# Patient Record
Sex: Female | Born: 1985 | Race: Black or African American | Hispanic: No | Marital: Married | Smoking: Current every day smoker
Health system: Southern US, Community
[De-identification: ages and names within clinical notes are randomized; demographics above are authoritative.]

## PROBLEM LIST (undated history)

## (undated) DIAGNOSIS — F4310 Post-traumatic stress disorder, unspecified: Secondary | ICD-10-CM

## (undated) DIAGNOSIS — F319 Bipolar disorder, unspecified: Secondary | ICD-10-CM

## (undated) DIAGNOSIS — R0600 Dyspnea, unspecified: Secondary | ICD-10-CM

## (undated) DIAGNOSIS — K219 Gastro-esophageal reflux disease without esophagitis: Secondary | ICD-10-CM

## (undated) DIAGNOSIS — F250 Schizoaffective disorder, bipolar type: Secondary | ICD-10-CM

## (undated) DIAGNOSIS — D649 Anemia, unspecified: Secondary | ICD-10-CM

## (undated) HISTORY — PX: OTHER SURGICAL HISTORY: SHX169

## (undated) HISTORY — DX: Bipolar disorder, unspecified: F31.9

## (undated) HISTORY — DX: Post-traumatic stress disorder, unspecified: F43.10

## (undated) HISTORY — DX: Schizoaffective disorder, bipolar type: F25.0

---

## 2007-08-26 HISTORY — PX: INDUCED ABORTION: SHX677

## 2015-01-07 ENCOUNTER — Emergency Department (HOSPITAL_COMMUNITY)
Admission: EM | Admit: 2015-01-07 | Discharge: 2015-01-07 | Disposition: A | Discharge status: Home / Self Care | Payer: Self-pay | Attending: Emergency Medicine | Admitting: Emergency Medicine

## 2015-01-07 ENCOUNTER — Encounter (HOSPITAL_COMMUNITY): Payer: Self-pay | Admitting: Emergency Medicine

## 2015-01-07 DIAGNOSIS — N61 Inflammatory disorders of breast: Secondary | ICD-10-CM | POA: Insufficient documentation

## 2015-01-07 DIAGNOSIS — R197 Diarrhea, unspecified: Secondary | ICD-10-CM | POA: Insufficient documentation

## 2015-01-07 DIAGNOSIS — Z720 Tobacco use: Secondary | ICD-10-CM | POA: Insufficient documentation

## 2015-01-07 DIAGNOSIS — R1084 Generalized abdominal pain: Secondary | ICD-10-CM | POA: Insufficient documentation

## 2015-01-07 DIAGNOSIS — R110 Nausea: Secondary | ICD-10-CM | POA: Insufficient documentation

## 2015-01-07 DIAGNOSIS — Z3202 Encounter for pregnancy test, result negative: Secondary | ICD-10-CM | POA: Insufficient documentation

## 2015-01-07 LAB — COMPREHENSIVE METABOLIC PANEL
ALBUMIN: 4.4 g/dL (ref 3.5–5.0)
ALT: 11 U/L — ABNORMAL LOW (ref 14–54)
AST: 12 U/L — ABNORMAL LOW (ref 15–41)
Alkaline Phosphatase: 46 U/L (ref 38–126)
Anion gap: 8 (ref 5–15)
BUN: 9 mg/dL (ref 6–20)
CALCIUM: 9.3 mg/dL (ref 8.9–10.3)
CO2: 23 mmol/L (ref 22–32)
CREATININE: 0.83 mg/dL (ref 0.44–1.00)
Chloride: 106 mmol/L (ref 101–111)
GLUCOSE: 111 mg/dL — AB (ref 65–99)
POTASSIUM: 3.9 mmol/L (ref 3.5–5.1)
Sodium: 137 mmol/L (ref 135–145)
Total Bilirubin: 0.6 mg/dL (ref 0.3–1.2)
Total Protein: 7.9 g/dL (ref 6.5–8.1)

## 2015-01-07 LAB — CBC WITH DIFFERENTIAL/PLATELET
BASOS PCT: 0 % (ref 0–1)
Basophils Absolute: 0 10*3/uL (ref 0.0–0.1)
EOS PCT: 2 % (ref 0–5)
Eosinophils Absolute: 0.1 10*3/uL (ref 0.0–0.7)
HCT: 38.4 % (ref 36.0–46.0)
Hemoglobin: 12 g/dL (ref 12.0–15.0)
LYMPHS ABS: 2 10*3/uL (ref 0.7–4.0)
Lymphocytes Relative: 39 % (ref 12–46)
MCH: 22.5 pg — AB (ref 26.0–34.0)
MCHC: 31.3 g/dL (ref 30.0–36.0)
MCV: 72 fL — ABNORMAL LOW (ref 78.0–100.0)
MONO ABS: 0.3 10*3/uL (ref 0.1–1.0)
Monocytes Relative: 5 % (ref 3–12)
NEUTROS ABS: 2.8 10*3/uL (ref 1.7–7.7)
NEUTROS PCT: 54 % (ref 43–77)
PLATELETS: 273 10*3/uL (ref 150–400)
RBC: 5.33 MIL/uL — AB (ref 3.87–5.11)
RDW: 15.6 % — ABNORMAL HIGH (ref 11.5–15.5)
WBC: 5.2 10*3/uL (ref 4.0–10.5)

## 2015-01-07 LAB — URINALYSIS, ROUTINE W REFLEX MICROSCOPIC
BILIRUBIN URINE: NEGATIVE
Glucose, UA: NEGATIVE mg/dL
HGB URINE DIPSTICK: NEGATIVE
Ketones, ur: NEGATIVE mg/dL
Leukocytes, UA: NEGATIVE
Nitrite: NEGATIVE
PH: 7 (ref 5.0–8.0)
Protein, ur: NEGATIVE mg/dL
Specific Gravity, Urine: 1.03 (ref 1.005–1.030)
UROBILINOGEN UA: 0.2 mg/dL (ref 0.0–1.0)

## 2015-01-07 LAB — PREGNANCY, URINE: PREG TEST UR: NEGATIVE

## 2015-01-07 LAB — LIPASE, BLOOD: LIPASE: 21 U/L — AB (ref 22–51)

## 2015-01-07 MED ORDER — SULFAMETHOXAZOLE-TRIMETHOPRIM 800-160 MG PO TABS: 1.0000 | ORAL_TABLET | Freq: Two times a day (BID) | ORAL | Status: DC

## 2015-01-07 MED ORDER — SULFAMETHOXAZOLE-TRIMETHOPRIM 800-160 MG PO TABS
1.0000 | ORAL_TABLET | Freq: Once | ORAL | Status: AC
  Administered 2015-01-07: 1 via ORAL
  Filled 2015-01-07: qty 1

## 2015-01-07 MED ORDER — HYDROCODONE-ACETAMINOPHEN 5-325 MG PO TABS
2.0000 | ORAL_TABLET | Freq: Once | ORAL | Status: AC
  Administered 2015-01-07: 2 via ORAL
  Filled 2015-01-07: qty 2

## 2015-01-07 MED ORDER — HYDROCODONE-ACETAMINOPHEN 5-325 MG PO TABS: 1.0000 | ORAL_TABLET | ORAL | Status: DC | PRN

## 2015-01-07 NOTE — ED Notes (Signed)
Awake. Verbally responsive. A/O x4. Resp even and unlabored. No audible adventitious breath sounds noted. ABC's intact.  

## 2015-01-07 NOTE — Discharge Instructions (Signed)
Take bactrim for cellulitis as directed until gone. Take vicodin for pain. Refer to attached documents for more information.

## 2015-01-07 NOTE — ED Notes (Signed)
Pt states she has a knot on the right breast x 2 days, also c/o n/d for the last two days. Pt also c/o generalized abd pain.

## 2015-01-07 NOTE — ED Provider Notes (Signed)
CSN: [id number]     Arrival date & time 01/07/15  1316 History   First MD Initiated Contact with Patient 01/07/15 1506     Chief Complaint  Patient presents with  . knot on right breast   . Nausea  . Diarrhea     (Consider location/radiation/quality/duration/timing/severity/associated sxs/prior Treatment) HPI Comments: Patient is a 29 year old female who presents with a 2 day history of abdominal pain and "knot" on her right breast. The pain is located in her generalized abdomen and does not radiate. The pain is described as cramping and moderate. The pain started gradually and progressively worsened since the onset. No alleviating/aggravating factors. The patient has tried nothing for symptoms without relief. Associated symptoms include diarrhea and some nausea. Patient denies fever, headache, vomiting, chest pain, SOB, dysuria, constipation, abnormal vaginal bleeding/discharge. Patient also complains of a painful knot on her right breast that she noticed in the shower 2 days ago. Palpation makes the pain worse. She reports associated redness. No alleviating factors.      History reviewed. No pertinent past medical history. History reviewed. No pertinent past surgical history. No family history on file. History  Substance Use Topics  . Smoking status: Current Every Day Smoker  . Smokeless tobacco: Not on file  . Alcohol Use: No   OB History    No data available     Review of Systems  Constitutional: Negative for fever, chills and fatigue.  HENT: Negative for trouble swallowing.   Eyes: Negative for visual disturbance.  Respiratory: Negative for shortness of breath.   Cardiovascular: Negative for chest pain and palpitations.  Gastrointestinal: Positive for abdominal pain. Negative for nausea, vomiting and diarrhea.  Genitourinary: Negative for dysuria and difficulty urinating.  Musculoskeletal: Negative for arthralgias and neck pain.  Skin: Positive for color change.   Neurological: Negative for dizziness and weakness.  Psychiatric/Behavioral: Negative for dysphoric mood.      Allergies  Other  Home Medications   Prior to Admission medications   Not on File   BP 142/92 mmHg  Pulse 86  Temp(Src) 98.9 F (37.2 C) (Oral)  Resp 20  Ht 5\' 2"  (1.575 m)  Wt 195 lb (88.451 kg)  BMI 35.66 kg/m2  SpO2 100%  LMP 12/23/2014 (Exact Date) Physical Exam  Constitutional: She is oriented to person, place, and time. She appears well-developed and well-nourished. No distress.  HENT:  Head: Normocephalic and atraumatic.  Eyes: Conjunctivae and EOM are normal.  Neck: Normal range of motion.  Cardiovascular: Normal rate and regular rhythm.  Exam reveals no gallop and no friction rub.   No murmur heard. Pulmonary/Chest: Effort normal and breath sounds normal. She has no wheezes. She has no rales. She exhibits no tenderness.  Abdominal: Soft. She exhibits no distension. There is no tenderness. There is no rebound.  Musculoskeletal: Normal range of motion.  Neurological: She is alert and oriented to person, place, and time. Coordination normal.  Speech is goal-oriented. Moves limbs without ataxia.   Skin: Skin is warm and dry.  3x2cm area of erythema and edema which is localized to the lower, inner right breast. No abscess or open wound.   Psychiatric: She has a normal mood and affect. Her behavior is normal.  Nursing note and vitals reviewed.   ED Course  Procedures (including critical care time) Labs Review Labs Reviewed  CBC WITH DIFFERENTIAL/PLATELET - Abnormal; Notable for the following:    RBC 5.33 (*)    MCV 72.0 (*)    [facility]  22.[facility] (*)    RDW 15.6 (*)    All other components within normal limits  COMPREHENSIVE METABOLIC PANEL - Abnormal; Notable for the following:    Glucose, Bld 111 (*)    AST 12 (*)    ALT 11 (*)    All other components within normal limits  LIPASE, BLOOD - Abnormal; Notable for the following:    Lipase 21 (*)    All  other components within normal limits  URINALYSIS, ROUTINE W REFLEX MICROSCOPIC - Abnormal; Notable for the following:    APPearance CLOUDY (*)    All other components within normal limits  PREGNANCY, URINE    Imaging Review No results found.   EKG Interpretation None      MDM   Final diagnoses:  Generalized abdominal pain  Cellulitis of female breast    4:22 PM Labs and urinalysis unremarkable for acute changes. Vitals stable and patient afebrile. Patient has a small area of right breast cellulitis in lower inner quadrant. No abscess. Patient will treated with antibiotics. Abdominal pain likely due to diarrhea from viral illness.    Emilia BeckKaitlyn Edward Guthmiller, PA-C 01/07/15 1640  Richardean Canalavid H Yao, MD 01/07/15 [phone]

## 2015-01-20 ENCOUNTER — Emergency Department (HOSPITAL_COMMUNITY)
Admission: EM | Admit: 2015-01-20 | Discharge: 2015-01-20 | Disposition: A | Discharge status: Home / Self Care | Payer: Self-pay | Attending: Emergency Medicine | Admitting: Emergency Medicine

## 2015-01-20 ENCOUNTER — Encounter (HOSPITAL_COMMUNITY): Payer: Self-pay | Admitting: Emergency Medicine

## 2015-01-20 DIAGNOSIS — Z720 Tobacco use: Secondary | ICD-10-CM | POA: Insufficient documentation

## 2015-01-20 DIAGNOSIS — K088 Other specified disorders of teeth and supporting structures: Secondary | ICD-10-CM | POA: Insufficient documentation

## 2015-01-20 DIAGNOSIS — Z792 Long term (current) use of antibiotics: Secondary | ICD-10-CM | POA: Insufficient documentation

## 2015-01-20 DIAGNOSIS — K0889 Other specified disorders of teeth and supporting structures: Secondary | ICD-10-CM

## 2015-01-20 DIAGNOSIS — K029 Dental caries, unspecified: Secondary | ICD-10-CM | POA: Insufficient documentation

## 2015-01-20 DIAGNOSIS — M542 Cervicalgia: Secondary | ICD-10-CM | POA: Insufficient documentation

## 2015-01-20 DIAGNOSIS — K006 Disturbances in tooth eruption: Secondary | ICD-10-CM | POA: Insufficient documentation

## 2015-01-20 MED ORDER — IBUPROFEN 800 MG PO TABS
800.0000 mg | ORAL_TABLET | Freq: Three times a day (TID) | ORAL | Status: DC
Start: 2015-01-20 — End: 2015-07-26

## 2015-01-20 MED ORDER — LIDOCAINE VISCOUS 2 % MT SOLN
20.0000 mL | OROMUCOSAL | Status: DC | PRN
Start: 2015-01-20 — End: 2015-07-26

## 2015-01-20 MED ORDER — AMOXICILLIN 500 MG PO CAPS
500.0000 mg | ORAL_CAPSULE | Freq: Three times a day (TID) | ORAL | Status: DC
Start: 2015-01-20 — End: 2015-07-26

## 2015-01-20 MED ORDER — LIDOCAINE VISCOUS 2 % MT SOLN
15.0000 mL | Freq: Once | OROMUCOSAL | Status: AC
  Administered 2015-01-20: 15 mL via OROMUCOSAL
  Filled 2015-01-20: qty 15

## 2015-01-20 NOTE — ED Notes (Signed)
Pt reports mouth pain on l/back side of lower jaw x 4 days

## 2015-01-20 NOTE — ED Provider Notes (Signed)
CSN: [id number]     Arrival date & time 01/20/15  1702 History  This chart was scribed for Francee PiccoloJennifer Taeya Theall, PA-C working with No att. providers found by Elveria Risingimelie Horne, ED Scribe. This patient was seen in room WTR9/WTR9 and the patient's care was started at 5:08 PM.   Chief Complaint  Patient presents with  . Dental Pain    mouth pain x 3 days   Patient is a 29 y.o. female presenting with tooth pain. The history is provided by the patient. No language interpreter was used.  Dental Pain Location:  Lower Quality:  Throbbing Worsened by:  Touching Associated symptoms: gum swelling and neck pain   Associated symptoms: no facial swelling and no fever    HPI Comments: Amy Salinas is a 29 y.o. female who presents to the Emergency Department complaining of throbbing lower left dental pain and swelling for 2-3 days. Patient reports onset of dental pain following a course of Bactrim for an abscess to her chest. Patient reports exacerbated pain with chewing and applied pressure. Patient reports treatment with Tylenol and Orajel without relief. Patient denies drainage from the site or difficulty breathing. Patient report pain in her left neck and behind left ear. Patient does not have a dentist.   History reviewed. No pertinent past medical history. History reviewed. No pertinent past surgical history. Family History  Problem Relation Age of Onset  . Hypertension Father   . Diabetes Father    History  Substance Use Topics  . Smoking status: Current Every Day Smoker  . Smokeless tobacco: Not on file  . Alcohol Use: No   OB History    No data available     Review of Systems  Constitutional: Negative for fever.  HENT: Positive for dental problem. Negative for facial swelling.   Musculoskeletal: Positive for neck pain.  All other systems reviewed and are negative.     Allergies  Other  Home Medications   Prior to Admission medications   Medication Sig Start Date End Date  Taking? Authorizing Provider  amoxicillin (AMOXIL) 500 MG capsule Take 1 capsule (500 mg total) by mouth 3 (three) times daily. 01/20/15   Francee PiccoloJennifer Aidan Caloca, PA-C  HYDROcodone-acetaminophen (NORCO/VICODIN) 5-325 MG per tablet Take 1-2 tablets by mouth every 4 (four) hours as needed for moderate pain. 01/07/15   Kaitlyn Szekalski, PA-C  ibuprofen (ADVIL,MOTRIN) 800 MG tablet Take 1 tablet (800 mg total) by mouth 3 (three) times daily. 01/20/15   Journee Kohen, PA-C  lidocaine (XYLOCAINE) 2 % solution Use as directed 20 mLs in the mouth or throat as needed for mouth pain. 01/20/15   Brettany Sydney, PA-C  sulfamethoxazole-trimethoprim (BACTRIM DS,SEPTRA DS) 800-160 MG per tablet Take 1 tablet by mouth 2 (two) times daily. 01/07/15   Kaitlyn Szekalski, PA-C   BP 118/75 mmHg  Pulse 74  Temp(Src) 98.6 F (37 C) (Oral)  SpO2 99%  LMP 12/23/2014 (Exact Date) Physical Exam  Constitutional: She is oriented to person, place, and time. She appears well-developed and well-nourished. No distress.  HENT:  Head: Normocephalic and atraumatic.  Right Ear: External ear normal.  Left Ear: External ear normal.  Nose: Nose normal.  Mouth/Throat: Uvula is midline, oropharynx is clear and moist and mucous membranes are normal. No trismus in the jaw. Abnormal dentition. Dental caries present. No dental abscesses or uvula swelling.    Submental and sublingual spaces are soft.  Eyes: Conjunctivae are normal.  Neck: Normal range of motion. Neck supple.  No nuchal rigidity.  Cardiovascular: Normal rate, regular rhythm and normal heart sounds.   Pulmonary/Chest: Effort normal and breath sounds normal.  Abdominal: Soft.  Musculoskeletal: Normal range of motion.  Neurological: She is alert and oriented to person, place, and time.  Skin: Skin is warm and dry. She is not diaphoretic.  Psychiatric: She has a normal mood and affect.  Nursing note and vitals reviewed.   ED Course  Procedures (including  critical care time) Medications  lidocaine (XYLOCAINE) 2 % viscous mouth solution 15 mL (15 mLs Mouth/Throat Given 01/20/15 1724)    COORDINATION OF CARE: 5:14 PM- Will discharge with antibiotics and refer to dentist. Discussed treatment plan with patient at bedside and patient agreed to plan.   Labs Review Labs Reviewed - No data to display  Imaging Review No results found.   EKG Interpretation None      MDM   Final diagnoses:  Pain, dental    Filed Vitals:   01/20/15 1725  BP: 118/75  Pulse: 74  Temp: 98.6 F (37 C)   Afebrile, NAD, non-toxic appearing, AAOx4.  Patient with toothache.  No gross abscess.  Exam unconcerning for Ludwig's angina or spread of infection.  Will treat with Amoxil and Xylocaine.  Urged patient to follow-up with dentist.  Patient is stable at time of discharge     I personally performed the services described in this documentation, which was scribed in my presence. The recorded information has been reviewed and is accurate.     Francee Piccolo, PA-C 01/20/15 1754  Linwood Dibbles, MD 01/24/15 [phone]

## 2015-01-20 NOTE — Discharge Instructions (Signed)
Please follow up with Dr. Lucky CowboyKnox to schedule a follow up appointment.  Please take your antibiotic until completion. Please read all discharge instructions and return precautions.    Dental Pain A tooth ache may be caused by cavities (tooth decay). Cavities expose the nerve of the tooth to air and hot or cold temperatures. It may come from an infection or abscess (also called a boil or furuncle) around your tooth. It is also often caused by dental caries (tooth decay). This causes the pain you are having. DIAGNOSIS  Your caregiver can diagnose this problem by exam. TREATMENT   If caused by an infection, it may be treated with medications which kill germs (antibiotics) and pain medications as prescribed by your caregiver. Take medications as directed.  Only take over-the-counter or prescription medicines for pain, discomfort, or fever as directed by your caregiver.  Whether the tooth ache today is caused by infection or dental disease, you should see your dentist as soon as possible for further care. SEEK MEDICAL CARE IF: The exam and treatment you received today has been provided on an emergency basis only. This is not a substitute for complete medical or dental care. If your problem worsens or new problems (symptoms) appear, and you are unable to meet with your dentist, call or return to this location. SEEK IMMEDIATE MEDICAL CARE IF:   You have a fever.  You develop redness and swelling of your face, jaw, or neck.  You are unable to open your mouth.  You have severe pain uncontrolled by pain medicine. MAKE SURE YOU:   Understand these instructions.  Will watch your condition.  Will get help right away if you are not doing well or get worse. Document Released: 08/11/2005 Document Revised: 11/03/2011 Document Reviewed: 03/29/2008 [facility] Patient Information 2015 [employer] [place]. This information is not intended to replace advice given to you by your health care provider. Make sure you  discuss any questions you have with your health care provider.

## 2015-07-26 ENCOUNTER — Emergency Department (HOSPITAL_COMMUNITY)
Admission: EM | Admit: 2015-07-26 | Discharge: 2015-07-26 | Disposition: A | Discharge status: Home / Self Care | Payer: 59 | Attending: Emergency Medicine | Admitting: Emergency Medicine

## 2015-07-26 ENCOUNTER — Encounter (HOSPITAL_COMMUNITY): Payer: Self-pay | Admitting: Emergency Medicine

## 2015-07-26 DIAGNOSIS — R110 Nausea: Secondary | ICD-10-CM

## 2015-07-26 DIAGNOSIS — R1110 Vomiting, unspecified: Secondary | ICD-10-CM

## 2015-07-26 DIAGNOSIS — R109 Unspecified abdominal pain: Secondary | ICD-10-CM | POA: Insufficient documentation

## 2015-07-26 DIAGNOSIS — R197 Diarrhea, unspecified: Secondary | ICD-10-CM | POA: Insufficient documentation

## 2015-07-26 DIAGNOSIS — Z79899 Other long term (current) drug therapy: Secondary | ICD-10-CM | POA: Insufficient documentation

## 2015-07-26 DIAGNOSIS — G8929 Other chronic pain: Secondary | ICD-10-CM | POA: Insufficient documentation

## 2015-07-26 DIAGNOSIS — K529 Noninfective gastroenteritis and colitis, unspecified: Secondary | ICD-10-CM

## 2015-07-26 DIAGNOSIS — R112 Nausea with vomiting, unspecified: Secondary | ICD-10-CM | POA: Insufficient documentation

## 2015-07-26 DIAGNOSIS — F17210 Nicotine dependence, cigarettes, uncomplicated: Secondary | ICD-10-CM | POA: Insufficient documentation

## 2015-07-26 LAB — URINALYSIS, ROUTINE W REFLEX MICROSCOPIC
Bilirubin Urine: NEGATIVE
GLUCOSE, UA: NEGATIVE mg/dL
Hgb urine dipstick: NEGATIVE
KETONES UR: NEGATIVE mg/dL
LEUKOCYTES UA: NEGATIVE
Nitrite: NEGATIVE
PH: 7.5 (ref 5.0–8.0)
Protein, ur: NEGATIVE mg/dL
Specific Gravity, Urine: 1.013 (ref 1.005–1.030)

## 2015-07-26 LAB — COMPREHENSIVE METABOLIC PANEL
ALBUMIN: 4.9 g/dL (ref 3.5–5.0)
ALT: 13 U/L — ABNORMAL LOW (ref 14–54)
AST: 14 U/L — ABNORMAL LOW (ref 15–41)
Alkaline Phosphatase: 55 U/L (ref 38–126)
Anion gap: 6 (ref 5–15)
BUN: 7 mg/dL (ref 6–20)
CHLORIDE: 104 mmol/L (ref 101–111)
CO2: 27 mmol/L (ref 22–32)
Calcium: 9.5 mg/dL (ref 8.9–10.3)
Creatinine, Ser: 0.78 mg/dL (ref 0.44–1.00)
GFR calc Af Amer: >60 mL/min (ref >60)
GFR calc non Af Amer: >60 mL/min (ref >60)
GLUCOSE: 87 mg/dL (ref 65–99)
POTASSIUM: 4.3 mmol/L (ref 3.5–5.1)
SODIUM: 137 mmol/L (ref 135–145)
Total Bilirubin: 0.6 mg/dL (ref 0.3–1.2)
Total Protein: 8.3 g/dL — ABNORMAL HIGH (ref 6.5–8.1)

## 2015-07-26 LAB — CBC
HEMATOCRIT: 37.7 % (ref 36.0–46.0)
Hemoglobin: 12.3 g/dL (ref 12.0–15.0)
MCH: 23.5 pg — AB (ref 26.0–34.0)
MCHC: 32.6 g/dL (ref 30.0–36.0)
MCV: 72.1 fL — AB (ref 78.0–100.0)
Platelets: 299 10*3/uL (ref 150–400)
RBC: 5.23 MIL/uL — ABNORMAL HIGH (ref 3.87–5.11)
RDW: 15.1 % (ref 11.5–15.5)
WBC: 6 10*3/uL (ref 4.0–10.5)

## 2015-07-26 LAB — LIPASE, BLOOD: Lipase: 28 U/L (ref 11–51)

## 2015-07-26 LAB — I-STAT BETA HCG BLOOD, ED (MC, WL, AP ONLY): I-stat hCG, quantitative: <5 m[IU]/mL (ref <5)

## 2015-07-26 MED ORDER — DICYCLOMINE HCL 20 MG PO TABS: 20.0000 mg | ORAL_TABLET | Freq: Two times a day (BID) | ORAL | Status: DC

## 2015-07-26 MED ORDER — DICYCLOMINE HCL 20 MG PO TABS
20.0000 mg | ORAL_TABLET | Freq: Once | ORAL | Status: AC
  Administered 2015-07-26: 20 mg via ORAL
  Filled 2015-07-26: qty 1

## 2015-07-26 MED ORDER — ONDANSETRON 8 MG PO TBDP: ORAL_TABLET | ORAL | Status: DC

## 2015-07-26 MED ORDER — ONDANSETRON 8 MG PO TBDP
8.0000 mg | ORAL_TABLET | Freq: Once | ORAL | Status: AC
  Administered 2015-07-26: 8 mg via ORAL
  Filled 2015-07-26: qty 1

## 2015-07-26 NOTE — ED Notes (Signed)
Patient reports nausea, vomiting and diarrhea since she was 29 years old.  Reports that she has been seen previously for same without further diagnosis.  Patient reports that she has her period for 3 weeks out of a month every month.  Reports "I wake up vomiting and I can only eat sometimes".

## 2015-07-26 NOTE — ED Notes (Signed)
Pt states that within 2 months she went from 230 to 195lbs.

## 2015-07-26 NOTE — Discharge Instructions (Signed)
1. Medications: zofran, bentyl, usual home medications 2. Treatment: rest, drink plenty of fluids, advance diet slowly 3. Follow Up: Please followup with [facility] in 3-5 days for discussion of your diagnoses and further evaluation after today's visit; if you do not have a primary care doctor use the resource guide provided to find one; Please return to the ER for persistent vomiting, high fevers or worsening symptoms

## 2015-07-26 NOTE — ED Notes (Signed)
Pt c/o lower abd pain that has been going on since she was [age]yrs old. Pt states that she has had endoscopy and colonoscopy before. Pt state that she also nauseated and diarrhea.  Pt states that effecting her life.

## 2015-07-26 NOTE — ED Provider Notes (Signed)
CSN: [id number]     Arrival date & time 07/26/15  1834 History   First MD Initiated Contact with Patient 07/26/15 2029     Chief Complaint  Patient presents with  . Abdominal Pain     (Consider location/radiation/quality/duration/timing/severity/associated sxs/prior Treatment) Patient is a 29 y.o. female presenting with abdominal pain. The history is provided by the patient and medical records. No language interpreter was used.  Abdominal Pain Associated symptoms: diarrhea, nausea and vomiting   Associated symptoms: no chest pain, no constipation, no cough, no dysuria, no fatigue, no fever, no hematuria and no shortness of breath      Amy Salinas is a 29 y.o. female  G1P0010 with no major medical problems presents to the Emergency Department complaining of intermittent, cramping lower abd pain onset at age 21.   Pt reports that she has some symptoms every day.  Associated symptoms include intermittent nausea, vomiting and diarrhea.  Pt denies bloody or bilious emesis, melena or hematochezia.  Nothing makes it better.  Pt reports eating and not eating makes it worse.  She has not taken anything to treat the symptoms.  Pt reports in Sept 2016 she weighed 230 and this week weighed 195 lbs.  Pt denies fever, chills, headache, neck pain, chest pain, SOB, weakness, dizziness, syncope, dysuria.  LMP: Jun 30, 2015.  Pt reports her menstrual cycle last approx 3 weeks of every month.  Pt reports she is from [place] and has had an endoscopy, stomach biopsy and colonoscopy without diagnosis.  Pt reports her last visit to a gastroenterologist was in 2009.  She has not seen anyone else of this problem since that time.  Patient denies international travel or recent antibiotics.   History reviewed. No pertinent past medical history. Past Surgical History  Procedure Laterality Date  . Biopsy of stomach    . Induced abortion     Family History  Problem Relation Age of Onset  . Hypertension Father   . Diabetes  Father    Social History  Substance Use Topics  . Smoking status: Current Every Day Smoker    Types: Cigarettes  . Smokeless tobacco: None  . Alcohol Use: No   OB History    No data available     Review of Systems  Constitutional: Negative for fever, diaphoresis, appetite change, fatigue and unexpected weight change.  HENT: Negative for mouth sores.   Eyes: Negative for visual disturbance.  Respiratory: Negative for cough, chest tightness, shortness of breath and wheezing.   Cardiovascular: Negative for chest pain.  Gastrointestinal: Positive for nausea, vomiting, abdominal pain and diarrhea. Negative for constipation.  Endocrine: Negative for polydipsia, polyphagia and polyuria.  Genitourinary: Negative for dysuria, urgency, frequency and hematuria.  Musculoskeletal: Negative for back pain and neck stiffness.  Skin: Negative for rash.  Allergic/Immunologic: Negative for immunocompromised state.  Neurological: Negative for syncope, light-headedness and headaches.  Hematological: Does not bruise/bleed easily.  Psychiatric/Behavioral: Negative for sleep disturbance. The patient is not nervous/anxious.       Allergies  Other  Home Medications   Prior to Admission medications   Medication Sig Start Date End Date Taking? Authorizing Provider  Multiple Vitamin (MULTIVITAMIN WITH MINERALS) TABS tablet Take 1 tablet by mouth daily.   Yes Historical Provider, MD  dicyclomine (BENTYL) 20 MG tablet Take 1 tablet (20 mg total) by mouth 2 (two) times daily. 07/26/15   Ashlee Player, PA-C  ondansetron (ZOFRAN ODT) 8 MG disintegrating tablet  ODT q4 hours prn nausea 07/26/15  Amarise Lillo, PA-C   BP 131/81 mmHg  Pulse 84  Temp(Src) 98.8 F (37.1 C) (Oral)  Resp 16  SpO2 100%  LMP 06/30/2015 Physical Exam  Constitutional: She appears well-developed and well-nourished. No distress.  Awake, alert, nontoxic appearance  HENT:  Head: Normocephalic and atraumatic.   Mouth/Throat: Oropharynx is clear and moist. No oropharyngeal exudate.  Eyes: Conjunctivae are normal. No scleral icterus.  Neck: Normal range of motion. Neck supple.  Cardiovascular: Normal rate, regular rhythm, normal heart sounds and intact distal pulses.   Pulmonary/Chest: Effort normal and breath sounds normal. No respiratory distress. She has no wheezes.  Equal chest expansion  Abdominal: Soft. Bowel sounds are normal. She exhibits no distension and no mass. There is no tenderness. There is no rebound and no guarding.  Musculoskeletal: Normal range of motion. She exhibits no edema.  Neurological: She is alert.  Speech is clear and goal oriented Moves extremities without ataxia  Skin: Skin is warm and dry. She is not diaphoretic.  Psychiatric: She has a normal mood and affect.  Nursing note and vitals reviewed.   ED Course  Procedures (including critical care time) Labs Review Labs Reviewed  COMPREHENSIVE METABOLIC PANEL - Abnormal; Notable for the following:    Total Protein 8.3 (*)    AST 14 (*)    ALT 13 (*)    All other components within normal limits  CBC - Abnormal; Notable for the following:    RBC 5.23 (*)    MCV 72.1 (*)    MCH 23.5 (*)    All other components within normal limits  URINALYSIS, ROUTINE W REFLEX MICROSCOPIC ([facility]) - Abnormal; Notable for the following:    APPearance CLOUDY (*)    All other components within normal limits  LIPASE, BLOOD  I-STAT BETA HCG BLOOD, ED ([facility], [facility], [facility] ONLY)    Imaging Review No results found. I have personally reviewed and evaluated these images and lab results as part of my medical decision-making.   EKG Interpretation None      MDM   Final diagnoses:  Chronic abdominal pain  Chronic vomiting  Chronic nausea  Chronic diarrhea   Amy Salinas presents with chronic nausea, vomiting and diarrhea.  No work-up in numerous years. Labs are reassuring. No anemia. No leukocytosis. No evidence of urinary tract  infection and no pregnancy. On repeat exam abdomen is soft and nontender. Mild improvement in discomfort with Zofran and Bentyl.  Patient tolerating by mouth fluids without difficulty.  She is requesting narcotic pain control however this time I do not feel that her chronic pain is best treated with narcotics. Will give Zofran and Bentyl for discharge home. Patient is to follow-[facility] with [facility] for further evaluation. Highly doubt appendicitis, diverticulitis, ruptured peptic ulcer, colitis.  She is well appearing and resting comfortably.    BP 131/81 mmHg  Pulse 84  Temp(Src) 98.8 F (37.1 C) (Oral)  Resp 16  SpO2 100%  LMP 06/30/2015   Dierdre ForthHannah Lilac Hoff, PA-C 07/26/15 2156  Rolland PorterMark James, MD 08/08/15 [phone]

## 2015-07-27 ENCOUNTER — Encounter: Payer: Self-pay | Admitting: Internal Medicine

## 2015-09-27 ENCOUNTER — Ambulatory Visit: Payer: Self-pay | Admitting: Internal Medicine

## 2015-11-29 ENCOUNTER — Encounter (HOSPITAL_COMMUNITY): Payer: Self-pay | Admitting: *Deleted

## 2015-11-29 ENCOUNTER — Emergency Department (HOSPITAL_COMMUNITY): Payer: Self-pay

## 2015-11-29 ENCOUNTER — Emergency Department (HOSPITAL_COMMUNITY)
Admission: EM | Admit: 2015-11-29 | Discharge: 2015-11-30 | Disposition: A | Discharge status: Home / Self Care | Payer: Self-pay | Attending: Emergency Medicine | Admitting: Emergency Medicine

## 2015-11-29 DIAGNOSIS — R1033 Periumbilical pain: Secondary | ICD-10-CM | POA: Insufficient documentation

## 2015-11-29 DIAGNOSIS — R197 Diarrhea, unspecified: Secondary | ICD-10-CM | POA: Insufficient documentation

## 2015-11-29 DIAGNOSIS — R0602 Shortness of breath: Secondary | ICD-10-CM | POA: Insufficient documentation

## 2015-11-29 DIAGNOSIS — S025XXA Fracture of tooth (traumatic), initial encounter for closed fracture: Secondary | ICD-10-CM

## 2015-11-29 DIAGNOSIS — K0381 Cracked tooth: Secondary | ICD-10-CM | POA: Insufficient documentation

## 2015-11-29 DIAGNOSIS — R079 Chest pain, unspecified: Secondary | ICD-10-CM | POA: Insufficient documentation

## 2015-11-29 DIAGNOSIS — F17210 Nicotine dependence, cigarettes, uncomplicated: Secondary | ICD-10-CM | POA: Insufficient documentation

## 2015-11-29 DIAGNOSIS — R112 Nausea with vomiting, unspecified: Secondary | ICD-10-CM | POA: Insufficient documentation

## 2015-11-29 LAB — COMPREHENSIVE METABOLIC PANEL
ALBUMIN: 4.3 g/dL (ref 3.5–5.0)
ALT: 10 U/L — ABNORMAL LOW (ref 14–54)
AST: 14 U/L — ABNORMAL LOW (ref 15–41)
Alkaline Phosphatase: 53 U/L (ref 38–126)
Anion gap: 9 (ref 5–15)
BUN: 6 mg/dL (ref 6–20)
CHLORIDE: 106 mmol/L (ref 101–111)
CO2: 25 mmol/L (ref 22–32)
CREATININE: 0.86 mg/dL (ref 0.44–1.00)
Calcium: 9.9 mg/dL (ref 8.9–10.3)
GFR calc Af Amer: >60 mL/min (ref >60)
GFR calc non Af Amer: >60 mL/min (ref >60)
Glucose, Bld: 104 mg/dL — ABNORMAL HIGH (ref 65–99)
Potassium: 3.7 mmol/L (ref 3.5–5.1)
SODIUM: 140 mmol/L (ref 135–145)
Total Bilirubin: 0.5 mg/dL (ref 0.3–1.2)
Total Protein: 7.9 g/dL (ref 6.5–8.1)

## 2015-11-29 LAB — CBC
HCT: 37 % (ref 36.0–46.0)
Hemoglobin: 11.8 g/dL — ABNORMAL LOW (ref 12.0–15.0)
MCH: 23 pg — ABNORMAL LOW (ref 26.0–34.0)
MCHC: 31.9 g/dL (ref 30.0–36.0)
MCV: 72 fL — ABNORMAL LOW (ref 78.0–100.0)
Platelets: 292 K/uL (ref 150–400)
RBC: 5.14 MIL/uL — ABNORMAL HIGH (ref 3.87–5.11)
RDW: 15.7 % — ABNORMAL HIGH (ref 11.5–15.5)
WBC: 5.8 K/uL (ref 4.0–10.5)

## 2015-11-29 LAB — I-STAT TROPONIN, ED: Troponin i, poc: 0 ng/mL (ref 0.00–0.08)

## 2015-11-29 LAB — URINALYSIS, ROUTINE W REFLEX MICROSCOPIC
GLUCOSE, UA: NEGATIVE mg/dL
Glucose, UA: NEGATIVE mg/dL
KETONES UR: 15 mg/dL — AB
Ketones, ur: 15 mg/dL — AB
Nitrite: POSITIVE — AB
Nitrite: NEGATIVE
PROTEIN: 100 mg/dL — AB
Protein, ur: 100 mg/dL — AB
Specific Gravity, Urine: 1.028 (ref 1.005–1.030)
Specific Gravity, Urine: 1.033 — ABNORMAL HIGH (ref 1.005–1.030)
pH: 5 (ref 5.0–8.0)
pH: 5.5 (ref 5.0–8.0)

## 2015-11-29 LAB — URINE MICROSCOPIC-ADD ON

## 2015-11-29 LAB — LIPASE, BLOOD: Lipase: 26 U/L (ref 11–51)

## 2015-11-29 MED ORDER — KETOROLAC TROMETHAMINE 30 MG/ML IJ SOLN
30.0000 mg | Freq: Once | INTRAMUSCULAR | Status: AC
  Administered 2015-11-29: 30 mg via INTRAVENOUS
  Filled 2015-11-29: qty 1

## 2015-11-29 MED ORDER — PENICILLIN V POTASSIUM 500 MG PO TABS: 1000.0000 mg | ORAL_TABLET | Freq: Two times a day (BID) | ORAL | Status: DC

## 2015-11-29 MED ORDER — NAPROXEN 500 MG PO TABS: 500.0000 mg | ORAL_TABLET | Freq: Two times a day (BID) | ORAL | Status: DC

## 2015-11-29 MED ORDER — ONDANSETRON HCL 4 MG/2ML IJ SOLN
4.0000 mg | Freq: Once | INTRAMUSCULAR | Status: AC
  Administered 2015-11-29: 4 mg via INTRAVENOUS
  Filled 2015-11-29: qty 2

## 2015-11-29 MED ORDER — ONDANSETRON 4 MG PO TBDP: ORAL_TABLET | ORAL | Status: DC

## 2015-11-29 NOTE — ED Provider Notes (Signed)
CSN: [id number]     Arrival date & time 11/29/15  1738 History   First MD Initiated Contact with Patient 11/29/15 2109     No chief complaint on file.    (Consider location/radiation/quality/duration/timing/severity/associated sxs/prior Treatment) HPI  30 year old female presents with multiple complaints. All of her symptoms started 2 days ago.  First complaint is abdominal pain. She states it is just left of her umbilicus. Patient states that it radiates down and up. Has been constant for 2 days.  Patient states the pain is constant and severe. She has had chronic intermittent abdominal pain since she was 30 years old with no obvious etiology. This feels different. She has had associated large amounts of vomiting and diarrhea as well. no blood in either. No dysuria or hematuria. She's currently on her menstrual cycle but no increased pain from this.   Her other complaint is of chest pain. States this started 2 days ago as well. Comes and goes. Feels like a pressure and tightness. Is not sure what makes it, and go. Some associate short of breath and feeling like she can't get a full breath. However no pleuritic pain or exertional pain. Does not radiate.  Third complaint is of left mandibular tooth pain. She states that one of her wisdom teeth is coming in as pushing her other teeth. She was eating applesauce when all of a sudden one of her mandibular teeth broke. Has been having pain since. No fevers or oral swelling. No trouble breathing.   History reviewed. No pertinent past medical history. Past Surgical History  Procedure Laterality Date  . Biopsy of stomach    . Induced abortion     Family History  Problem Relation Age of Onset  . Hypertension Father   . Diabetes Father    Social History  Substance Use Topics  . Smoking status: Current Every Day Smoker -- 0.25 packs/day    Types: Cigarettes  . Smokeless tobacco: None  . Alcohol Use: No   OB History    No data available      Review of Systems  Constitutional: Negative for fever.  HENT: Positive for dental problem.   Respiratory: Positive for shortness of breath. Negative for cough.   Cardiovascular: Positive for chest pain.  Gastrointestinal: Positive for nausea, vomiting, abdominal pain and diarrhea. Negative for blood in stool.  Genitourinary: Positive for vaginal bleeding (currently on period). Negative for dysuria.  All other systems reviewed and are negative.     Allergies  Other  Home Medications   Prior to Admission medications   Not on File   BP 116/69 mmHg  Pulse 65  Temp(Src) 98.3 F (36.8 C) (Oral)  Resp 19  Ht  (1.575 m)  Wt 178 lb 3 oz (80.825 kg)  BMI 32.58 kg/m2  SpO2 100%  LMP 11/29/2015 Physical Exam  Constitutional: She is oriented to person, place, and time. She appears well-developed and well-nourished.  HENT:  Head: Normocephalic and atraumatic.  Right Ear: External ear normal.  Left Ear: External ear normal.  Nose: Nose normal.  Mouth/Throat:    Eyes: Right eye exhibits no discharge. Left eye exhibits no discharge.  Cardiovascular: Normal rate, regular rhythm and normal heart sounds.   Pulmonary/Chest: Effort normal and breath sounds normal. She exhibits no tenderness.  Abdominal: Soft. She exhibits no distension. There is no tenderness.  Neurological: She is alert and oriented to person, place, and time.  Skin: Skin is warm and dry.  Nursing note and vitals reviewed.  ED Course  Procedures (including critical care time) Labs Review Labs Reviewed  COMPREHENSIVE METABOLIC PANEL - Abnormal; Notable for the following:    Glucose, Bld 104 (*)    AST 14 (*)    ALT 10 (*)    All other components within normal limits  CBC - Abnormal; Notable for the following:    RBC 5.14 (*)    Hemoglobin 11.8 (*)    MCV 72.0 (*)    MCH 23.0 (*)    RDW 15.7 (*)    All other components within normal limits  URINALYSIS, ROUTINE W REFLEX MICROSCOPIC (NOT AT [facility]) -  Abnormal; Notable for the following:    Color, Urine RED (*)    APPearance TURBID (*)    Hgb urine dipstick LARGE (*)    Bilirubin Urine MODERATE (*)    Ketones, ur 15 (*)    Protein, ur 100 (*)    Nitrite POSITIVE (*)    Leukocytes, UA MODERATE (*)    All other components within normal limits  URINE MICROSCOPIC-ADD ON - Abnormal; Notable for the following:    Squamous Epithelial / LPF 6-30 (*)    Bacteria, UA MANY (*)    All other components within normal limits  URINALYSIS, ROUTINE W REFLEX MICROSCOPIC (NOT AT [facility]) - Abnormal; Notable for the following:    Color, Urine RED (*)    APPearance TURBID (*)    Specific Gravity, Urine 1.033 (*)    Hgb urine dipstick LARGE (*)    Bilirubin Urine MODERATE (*)    Ketones, ur 15 (*)    Protein, ur 100 (*)    Leukocytes, UA SMALL (*)    All other components within normal limits  URINE MICROSCOPIC-ADD ON - Abnormal; Notable for the following:    Squamous Epithelial / LPF 0-5 (*)    Bacteria, UA FEW (*)    Crystals CA OXALATE CRYSTALS (*)    All other components within normal limits  LIPASE, BLOOD  I-STAT TROPOININ, ED    Imaging Review Dg Chest 2 View  11/29/2015  CLINICAL DATA:  Dyspnea and left-sided chest pain for 2 days. EXAM: CHEST  2 VIEW COMPARISON:  None. FINDINGS: The heart size and mediastinal contours are within normal limits. Both lungs are clear. The visualized skeletal structures are unremarkable. IMPRESSION: No active cardiopulmonary disease. Electronically Signed   By: Ellery Plunkaniel R Mitchell M.D.   On: 11/29/2015 22:26   I have personally reviewed and evaluated these images and lab results as part of my medical decision-making.   EKG Interpretation   Date/Time:  Thursday November 29 2015 22:31:51 EDT Ventricular Rate:  66 PR Interval:  167 QRS Duration: 92 QT Interval:  393 QTC Calculation: 412 R Axis:   95 Text Interpretation:  Sinus rhythm Borderline right axis deviation No old  tracing to compare Confirmed by  Tashena Ibach  MD, Mischa Brittingham ([id number]) on 11/29/2015  10:38:55 PM      MDM   Final diagnoses:  Periumbilical abdominal pain  Chest pain, unspecified chest pain type  Broken tooth, closed, initial encounter    Patient with multiple different complaints. Her abdominal pain is likely related to a viral GI illness given vomiting and diarrhea. On exam she has no reproducible tenderness. I do not feel CT or ultrasound imaging is needed. Initially her urine looks like a UTI but when I instructed her to clean better her urinalysis is no longer consistent with UTI. Given she is not symptomatic and do not think treatment would be  indicated. Her chest pain is atypical and with a benign ECG and benign troponin after 2 days of symptoms and do not think further workup is needed in a low risk patient. She has no PE risk factors on questioning and has no tachycardia or hypoxia. Her tooth does appear to be broken but no gingival inflammation or signs of abscess. However given his broken I will treat with penicillin. Refer to dentist on call. No vomiting while in the ED. Discussed return precautions.    Pricilla Loveless, MD 11/29/15 [phone]

## 2015-11-29 NOTE — ED Notes (Signed)
Pt c/o L lower tooth pain, pt has broken tooth in the L lower mouth, pt c/o generalized abd pain onset x 2-3 days, pt reports n/v/d, pt reports x 4 vomiting episodes & 5-10 diarrhea today, pt A&O x4, follows commands, speaks in complete sentences

## 2015-11-29 NOTE — Discharge Instructions (Signed)
Community Resource Guide Dental °The [employer]’s “211” is a great source of information about community services available.  Access by dialing 2-1-1 from anywhere in [place], or by website -  www.nc211.org.  ° °Other Local Resources (Updated 08/2015) ° °Dental  Care °  °Services ° °  °Phone Number and Address  °Cost  °[facility] For children 0 - 29 years of age:  °• Cleaning °• Tooth brushing/flossing instruction °• Sealants, fillings, crowns °• Extractions °• Emergency treatment  [phone] °[address] °[place], [place] [address] Charges based on family income.  Medicaid and some insurance plans accepted.   °  °[facility] [place] • Cleaning °• Sealants, fillings, crowns °• Extractions °• Emergency treatment [phone] °[address] °[place], [place] ° Pregnant women 18 years of age or older with a Medicaid card  °[facility] [place] • Cleaning °• Sealants, fillings, crowns °• Extractions °• Emergency treatment [phone] °[address] °[place], [place] Pregnant women 18 years of age or older with a Medicaid card  °[facility] For children 0 - 29 years of age:  °• Cleaning °• Tooth brushing/flossing instruction °• Sealants, fillings, crowns °• Extractions °• Emergency treatment °Limited orthodontic services for patients with Medicaid [phone] °[address] °[place], [place] [address] Medicaid and [place] Health Choice cover for children up to age 29 and pregnant women.  Parents of children up to age 29 without Medicaid pay a reduced fee at time of service.  °[facility] [place] For children 0 - 29 years of age:  °• Cleaning °• Tooth brushing/flossing instruction °• Sealants, fillings, crowns °• Extractions °• Emergency treatment °Limited orthodontic services for patients with Medicaid  [phone] °[address] °[place], [place].  Medicaid and [place] Health Choice cover for children up to age 29 and pregnant women.  Parents of children up to age 29 without Medicaid pay a reduced fee.  °[facility] • Cleaning °• Sealants, fillings, crowns °• Extractions ° °Hours: Tuesdays and Thursdays, 4:15 - 8 pm [phone] °[address] °[place], [place] [address] Services free of charge to [place] residents ages 18-64 who do not have health insurance, Medicare, Medicaid, or VA benefits and fall within federal poverty guidelines  °[facility] ° ° ° Provides dental care in addition to primary medical care, nutritional counseling, and pharmacy: °• Cleaning °• Sealants, fillings, crowns °• Extractions ° ° ° ° ° ° ° ° ° ° ° ° ° ° ° ° ° [phone] °[facility], [address] °[place], [place] ° °[phone] °[facility], [address] [place], [place] ° °[phone] °[facility] °[place], [place] ° °[phone] °[facility], [address] °[place], [place] ° °[phone] °[facility] °[address] °[place], [place] Accepts Medicaid, Medicare, most insurance.  Also provides services available to all with fees adjusted based on ability to pay.    °[facility] • Cleaning °• Tooth brushing/flossing instruction °• Sealants, fillings, crowns °• Extractions °• Emergency treatment °Hours: Tuesdays, Thursdays, and Fridays from 8 am to 5 pm by appointment only. [phone] °[address] °[place], [place] [address] [place] residents with Medicaid (depending on eligibility) and children with  Health Choice - call for more information.  °  [facility] • Extractions only ° °Hours: 2nd and 4th Thursday of each month from 6:30 am - 9 am.   [phone] ext. [phone] °[address] °[place], [place] [address] Ages 18 and older only.  Patients are seen on a first come, first served basis.  °[facility] • Cleanings °• Fillings °• Extractions °• Orthodontics °• Endodontics °• Implants/Crowns/Bridges °• Complete and partial dentures [phone] °[place], [place] Patients must complete an application for services.  There is often a waiting list.   ° °

## 2015-12-03 MED FILL — NAPROXEN 500 MG TABLET: 500 | 7 days supply | Qty: 14 | Fill #0

## 2015-12-03 MED FILL — PENICILLIN VK 500 MG TABLET: 500 | 7 days supply | Qty: 28 | Fill #0

## 2015-12-03 MED FILL — ONDANSETRON ODT 4 MG TABLET: 4 | 2 days supply | Qty: 4 | Fill #0

## 2015-12-04 ENCOUNTER — Emergency Department (HOSPITAL_COMMUNITY)
Admission: EM | Admit: 2015-12-04 | Discharge: 2015-12-04 | Disposition: A | Discharge status: Home / Self Care | Payer: Self-pay | Attending: Emergency Medicine | Admitting: Emergency Medicine

## 2015-12-04 ENCOUNTER — Encounter (HOSPITAL_COMMUNITY): Payer: Self-pay | Admitting: Emergency Medicine

## 2015-12-04 DIAGNOSIS — Z791 Long term (current) use of non-steroidal anti-inflammatories (NSAID): Secondary | ICD-10-CM | POA: Insufficient documentation

## 2015-12-04 DIAGNOSIS — G8918 Other acute postprocedural pain: Secondary | ICD-10-CM | POA: Insufficient documentation

## 2015-12-04 DIAGNOSIS — K08409 Partial loss of teeth, unspecified cause, unspecified class: Secondary | ICD-10-CM | POA: Insufficient documentation

## 2015-12-04 DIAGNOSIS — K0889 Other specified disorders of teeth and supporting structures: Secondary | ICD-10-CM | POA: Insufficient documentation

## 2015-12-04 DIAGNOSIS — F17210 Nicotine dependence, cigarettes, uncomplicated: Secondary | ICD-10-CM | POA: Insufficient documentation

## 2015-12-04 MED ORDER — KETOROLAC TROMETHAMINE 60 MG/2ML IM SOLN
60.0000 mg | Freq: Once | INTRAMUSCULAR | Status: AC
  Administered 2015-12-04: 60 mg via INTRAMUSCULAR
  Filled 2015-12-04: qty 2

## 2015-12-04 MED ORDER — IBUPROFEN 800 MG PO TABS: 800.0000 mg | ORAL_TABLET | Freq: Four times a day (QID) | ORAL | Status: DC | PRN

## 2015-12-04 MED ORDER — TRAMADOL HCL 50 MG PO TABS: 50.0000 mg | ORAL_TABLET | Freq: Four times a day (QID) | ORAL | Status: DC | PRN

## 2015-12-04 NOTE — ED Notes (Signed)
Pt states she just had tooth removed on her bottom lower and wasn't given anything for pain. Bleeding is controlled with a gauze packing.

## 2015-12-04 NOTE — ED Provider Notes (Signed)
CSN: [id number]     Arrival date & time 12/04/15  1057 History  By signing my name below, I, Essence Howell, attest that this documentation has been prepared under the direction and in the presence of Charlestine Nighthristopher Keayra [place], PA-C Electronically Signed: Charline BillsEssence Howell, ED Scribe 12/04/2015 at 11:36 AM.   Chief Complaint  Patient presents with  . Dental Pain   The history is provided by the patient. No language interpreter was used.   HPI Comments: Amy Salinas is a 30 y.o. female who presents to the Emergency Department complaining of constant left lower dental pain onset this morning. Pt had a tooth extracted PTA and came to the ED for pain medication. Bleeding is controlled with a gauze packing. No other treatments tried PTA.   History reviewed. No pertinent past medical history. Past Surgical History  Procedure Laterality Date  . Biopsy of stomach    . Induced abortion     Family History  Problem Relation Age of Onset  . Hypertension Father   . Diabetes Father    Social History  Substance Use Topics  . Smoking status: Current Every Day Smoker -- 0.25 packs/day    Types: Cigarettes  . Smokeless tobacco: None  . Alcohol Use: No   OB History    No data available     Review of Systems  A complete 10 system review of systems was obtained and all systems are negative except as noted in the HPI and PMH.  Allergies  Other  Home Medications   Prior to Admission medications   Medication Sig Start Date End Date Taking? Authorizing Provider  naproxen (NAPROSYN) 500 MG tablet Take 1 tablet (500 mg total) by mouth 2 (two) times daily. 11/29/15   Pricilla LovelessScott Goldston, MD  ondansetron (ZOFRAN ODT) 4 MG disintegrating tablet 4mg  ODT q6 hours prn nausea/vomiting 11/29/15   Pricilla LovelessScott Goldston, MD  penicillin v potassium (VEETID) 500 MG tablet Take 2 tablets (1,000 mg total) by mouth 2 (two) times daily. X 7 days 11/29/15   Pricilla LovelessScott Goldston, MD   BP 122/81 mmHg  Pulse 75  Temp(Src) 99.5 F (37.5 C) (Oral)   Resp 16  Ht 5\' 2"  (1.575 m)  Wt 178 lb (80.74 kg)  BMI 32.55 kg/m2  SpO2 100%  LMP 11/28/2015 Physical Exam  Constitutional: She is oriented to person, place, and time. She appears well-developed and well-nourished. No distress.  HENT:  Head: Normocephalic and atraumatic.  L lower 1st molar extracted with a clot over the socket. No drainage or bleeding at this time.   Eyes: Conjunctivae and EOM are normal.  Neck: Neck supple. No tracheal deviation present.  Cardiovascular: Normal rate.   Pulmonary/Chest: Effort normal. No respiratory distress.  Musculoskeletal: Normal range of motion.  Neurological: She is alert and oriented to person, place, and time.  Skin: Skin is warm and dry.  Psychiatric: She has a normal mood and affect. Her behavior is normal.  Nursing note and vitals reviewed.  ED Course  Procedures (including critical care time) DIAGNOSTIC STUDIES: Oxygen Saturation is 100% on RA, normal by my interpretation.    COORDINATION OF CARE: 11:34 AM-Discussed treatment plan which includes Toradol injection with pt at bedside and pt agreed to plan.   The extraction site of the tooth appears to be consistent with normal findings following a tooth extraction.  The patient is advised follow-up with her dentist.  Told to return here as needed.  Told use warm compresses along the jawline to reduce swelling  Charlestine Night, PA-C 12/04/15 1138  Derwood Kaplan, MD 12/05/15 [phone]

## 2015-12-04 NOTE — Discharge Instructions (Signed)
Return here as needed.  Follow-up with your dentist, use warm compresses to help reduce swelling along the jawline where the tooth was extracted

## 2015-12-31 ENCOUNTER — Ambulatory Visit: Payer: Self-pay | Admitting: Family Medicine

## 2016-01-14 ENCOUNTER — Ambulatory Visit: Payer: Self-pay | Admitting: Family Medicine

## 2016-02-01 ENCOUNTER — Ambulatory Visit: Payer: Self-pay | Admitting: Family Medicine

## 2016-03-10 ENCOUNTER — Ambulatory Visit (INDEPENDENT_AMBULATORY_CARE_PROVIDER_SITE_OTHER): Payer: Self-pay | Admitting: Family Medicine

## 2016-03-10 ENCOUNTER — Encounter: Payer: Self-pay | Admitting: Family Medicine

## 2016-03-10 ENCOUNTER — Other Ambulatory Visit: Payer: Self-pay | Admitting: Family Medicine

## 2016-03-10 VITALS — BP 129/93 | HR 94 | Temp 98.9°F | Resp 16 | Ht 62.0 in | Wt 179.0 lb

## 2016-03-10 DIAGNOSIS — R197 Diarrhea, unspecified: Secondary | ICD-10-CM

## 2016-03-10 DIAGNOSIS — R1110 Vomiting, unspecified: Secondary | ICD-10-CM

## 2016-03-10 LAB — CBC WITH DIFFERENTIAL/PLATELET
BASOS ABS: 0 {cells}/uL (ref 0–200)
Basophils Relative: 0 %
Eosinophils Absolute: 62 cells/uL (ref 15–500)
Eosinophils Relative: 1 %
HEMATOCRIT: 37.1 % (ref 35.0–45.0)
Hemoglobin: 11.7 g/dL (ref 11.7–15.5)
LYMPHS ABS: 2108 {cells}/uL (ref 850–3900)
LYMPHS PCT: 34 %
MCH: 23.4 pg — AB (ref 27.0–33.0)
MCHC: 31.5 g/dL — AB (ref 32.0–36.0)
MCV: 74.2 fL — AB (ref 80.0–100.0)
MONO ABS: 434 {cells}/uL (ref 200–950)
MPV: 9.8 fL (ref 7.5–12.5)
Monocytes Relative: 7 %
NEUTROS PCT: 58 %
Neutro Abs: 3596 cells/uL (ref 1500–7800)
Platelets: 279 10*3/uL (ref 140–400)
RBC: 5 MIL/uL (ref 3.80–5.10)
RDW: 16.6 % — AB (ref 11.0–15.0)
WBC: 6.2 10*3/uL (ref 3.8–10.8)

## 2016-03-10 LAB — TSH: TSH: 0.3 m[IU]/L — AB

## 2016-03-10 MED ORDER — ONDANSETRON 4 MG PO TBDP: ORAL_TABLET | ORAL | Status: DC

## 2016-03-10 NOTE — Patient Instructions (Signed)
Follow up when you get your orange card so we can do further assessment.

## 2016-03-11 ENCOUNTER — Other Ambulatory Visit: Payer: Self-pay | Admitting: Family Medicine

## 2016-03-11 DIAGNOSIS — Z1329 Encounter for screening for other suspected endocrine disorder: Secondary | ICD-10-CM

## 2016-03-11 LAB — COMPLETE METABOLIC PANEL WITH GFR
ALBUMIN: 4.3 g/dL (ref 3.6–5.1)
ALK PHOS: 50 U/L (ref 33–115)
ALT: 8 U/L (ref 6–29)
AST: 13 U/L (ref 10–30)
BUN: 11 mg/dL (ref 7–25)
CALCIUM: 9.4 mg/dL (ref 8.6–10.2)
CO2: 23 mmol/L (ref 20–31)
Chloride: 105 mmol/L (ref 98–110)
Creat: 0.8 mg/dL (ref 0.50–1.10)
Glucose, Bld: 86 mg/dL (ref 65–99)
POTASSIUM: 3.9 mmol/L (ref 3.5–5.3)
Sodium: 139 mmol/L (ref 135–146)
Total Bilirubin: 0.5 mg/dL (ref 0.2–1.2)
Total Protein: 7.1 g/dL (ref 6.1–8.1)

## 2016-03-11 LAB — T4: T4, Total: 6.7 ug/dL (ref 4.5–12.0)

## 2016-03-11 NOTE — Progress Notes (Signed)
Patient ID: Amy Salinas, female   DOB: 01-04-1986, 30 y.o.   MRN: [id number]   Amy Salinas, is a 30 y.o. female  WUJ:[id number]  [id number]  DOB - 17-Feb-1986  CC:  Chief Complaint  Patient presents with  . Establish Care  . Abdominal Pain  . Diarrhea       HPI: Amy Salinas is a 30 y.o. female here to establish care and complaining of chronic abdominal pain with vomiting and diarrhea. She reports this has been going on for year and no one has been able to discover a cause. She reports vomiting 4 times a day and have diarrhea 10 times a day. Her pain is periumbilical. She is obese and has had no recent weight loss. She reports having been tried on several medications including Nexium and Prilosec. She  Has noticed no aggravating or alleviating factors.  I have explained that I am unlikely to be able to make a definitive diagnosis and she needs to see a GI specialist. She has no insurance or financial assistance to be able to do this She is in process of applying for an orange card. She has been to ED on several occasions for this.  She reports smoking about 5 cigarettes a day and using alcohol rarely and denies illicit drug use. She has not made a association between what she eats and her symptoms. She reports a history of low blood sugars (in the 30s). I have no documentation of that. She reports no special diet and does not exercise regularly. Allergies  Allergen Reactions  . Other Anaphylaxis    broccoli   History reviewed. No pertinent past medical history. Current Outpatient Prescriptions on File Prior to Visit  Medication Sig Dispense Refill  . ibuprofen (ADVIL,MOTRIN) 800 MG tablet Take 1 tablet (800 mg total) by mouth every 6 (six) hours as needed. (Patient not taking: Reported on 03/10/2016) 21 tablet 0  . naproxen (NAPROSYN) 500 MG tablet Take 1 tablet (500 mg total) by mouth 2 (two) times daily. (Patient not taking: Reported on 03/10/2016) 14 tablet 0  . penicillin v  potassium (VEETID) 500 MG tablet Take 2 tablets (1,000 mg total) by mouth 2 (two) times daily. X 7 days (Patient not taking: Reported on 03/10/2016) 28 tablet 0  . traMADol (ULTRAM) 50 MG tablet Take 1 tablet (50 mg total) by mouth every 6 (six) hours as needed for severe pain. (Patient not taking: Reported on 03/10/2016) 15 tablet 0   No current facility-administered medications on file prior to visit.   Family History  Problem Relation Age of Onset  . Hypertension Father   . Diabetes Father    Social History   Social History  . Marital Status: Single    Spouse Name: N/A  . Number of Children: N/A  . Years of Education: N/A   Occupational History  . Not on file.   Social History Main Topics  . Smoking status: Current Every Day Smoker -- 0.25 packs/day    Types: Cigarettes  . Smokeless tobacco: Not on file  . Alcohol Use: Yes     Comment: occ  . Drug Use: No  . Sexual Activity: Not on file   Other Topics Concern  . Not on file   Social History Narrative    Review of Systems: Constitutional: Negative for fever, chills, appetite change, weight loss. Positive for fatigue. She reports a weight loss from 230 to 179 at some point. Skin: Negative for rashes or lesions of concern. HENT:  Negative for ear pain, ear discharge.nose bleeds Eyes: Negative for pain, discharge, redness, itching and visual disturbance. Neck: Negative for pain, stiffness Respiratory: Negative for cough, shortness of breath,   Cardiovascular: Negative  palpitations and leg swelling. She reports upper abdomen and chest pain that is tight, sharp, takes breath away and last 45-60 minutes Gastrointestinal: Positive  for abdominal pain, nausea, vomiting, diarrhea, heartburn Genitourinary: Negative for dysuria, urgency, frequency, hematuria,  Musculoskeletal: Negative for joint pain, joint  swelling, and gait problem.Negative for weakness.Positive for back pain Neurological: Negative for dizziness, tremors,  seizures, syncope,   light-headedness, numbness and headaches.  Hematological: Negative for easy bruising or bleeding Psychiatric/Behavioral: Negative for depression, decreased concentration, confusion. Positive for anxiety   Objective:   Filed Vitals:   03/10/16 1343  BP: 129/93  Pulse: 94  Temp: 98.9 F (37.2 C)  Resp: 16    Physical Exam: Constitutional: Patient appears well-developed and well-nourished. No distress. HENT: Normocephalic, atraumatic, External right and left ear normal. Oropharynx is clear and moist.  Eyes: Conjunctivae and EOM are normal. PERRLA, no scleral icterus. Neck: Normal ROM. Neck supple. No lymphadenopathy, No thyromegaly. CVS: RRR, S1/S2 +, no murmurs, no gallops, no rubs Pulmonary: Effort and breath sounds normal, no stridor, rhonchi, wheezes, rales.  Abdominal: Soft. Normoactive BS,, no distension, tenderness, rebound or guarding. She endorses tenderness in every area of her abdomen Musculoskeletal: Normal range of motion. No edema and no tenderness.  Neuro: Alert.Normal muscle tone coordination. Non-focal Skin: Skin is warm and dry. No rash noted. Not diaphoretic. No erythema. No pallor. Psychiatric: Exhibits a flat affect  Lab Results  Component Value Date   WBC 6.2 03/10/2016   HGB 11.7 03/10/2016   HCT 37.1 03/10/2016   MCV 74.2* 03/10/2016   PLT 279 03/10/2016   Lab Results  Component Value Date   CREATININE 0.80 03/10/2016   BUN 11 03/10/2016   NA 139 03/10/2016   K 3.9 03/10/2016   CL 105 03/10/2016   CO2 23 03/10/2016    No results found for: HGBA1C Lipid Panel  No results found for: CHOL, TRIG, HDL, CHOLHDL, VLDL, LDLCALC     Assessment and plan:   1. Vomiting and diarrhea  - CBC with Differential - COMPLETE METABOLIC PANEL WITH GFR - TSH   Return if symptoms worsen or fail to improve.  The patient was given clear instructions to go to ER or return to medical center if symptoms don't improve, worsen or new problems  develop. The patient verbalized understanding.    Henrietta HooverLinda C Bernhardt FNP  03/11/2016, 12:08 PM

## 2016-03-12 LAB — T3: T3, Total: 79 ng/dL (ref 76–181)

## 2016-04-26 ENCOUNTER — Emergency Department (HOSPITAL_COMMUNITY)
Admission: EM | Admit: 2016-04-26 | Discharge: 2016-04-27 | Disposition: A | Discharge status: Home / Self Care | Payer: Medicaid Other | Attending: Emergency Medicine | Admitting: Emergency Medicine

## 2016-04-26 ENCOUNTER — Encounter (HOSPITAL_COMMUNITY): Payer: Self-pay | Admitting: Emergency Medicine

## 2016-04-26 ENCOUNTER — Emergency Department (HOSPITAL_COMMUNITY): Payer: Medicaid Other

## 2016-04-26 DIAGNOSIS — Z791 Long term (current) use of non-steroidal anti-inflammatories (NSAID): Secondary | ICD-10-CM | POA: Insufficient documentation

## 2016-04-26 DIAGNOSIS — R0781 Pleurodynia: Secondary | ICD-10-CM | POA: Insufficient documentation

## 2016-04-26 DIAGNOSIS — R1012 Left upper quadrant pain: Secondary | ICD-10-CM | POA: Insufficient documentation

## 2016-04-26 DIAGNOSIS — F17210 Nicotine dependence, cigarettes, uncomplicated: Secondary | ICD-10-CM | POA: Insufficient documentation

## 2016-04-26 HISTORY — DX: Gastro-esophageal reflux disease without esophagitis: K21.9

## 2016-04-26 LAB — CBC
HEMATOCRIT: 33.1 % — AB (ref 36.0–46.0)
HEMOGLOBIN: 11.3 g/dL — AB (ref 12.0–15.0)
MCH: 23.8 pg — AB (ref 26.0–34.0)
MCHC: 34.1 g/dL (ref 30.0–36.0)
MCV: 69.8 fL — AB (ref 78.0–100.0)
Platelets: 267 10*3/uL (ref 150–400)
RBC: 4.74 MIL/uL (ref 3.87–5.11)
RDW: 14.6 % (ref 11.5–15.5)
WBC: 7.5 10*3/uL (ref 4.0–10.5)

## 2016-04-26 LAB — BASIC METABOLIC PANEL
ANION GAP: 7 (ref 5–15)
BUN: 11 mg/dL (ref 6–20)
CALCIUM: 9.3 mg/dL (ref 8.9–10.3)
CHLORIDE: 108 mmol/L (ref 101–111)
CO2: 25 mmol/L (ref 22–32)
Creatinine, Ser: 0.88 mg/dL (ref 0.44–1.00)
GFR calc Af Amer: >60 mL/min (ref >60)
GFR calc non Af Amer: >60 mL/min (ref >60)
GLUCOSE: 105 mg/dL — AB (ref 65–99)
POTASSIUM: 3.5 mmol/L (ref 3.5–5.1)
Sodium: 140 mmol/L (ref 135–145)

## 2016-04-26 LAB — D-DIMER, QUANTITATIVE (NOT AT ARMC)

## 2016-04-26 LAB — I-STAT TROPONIN, ED: Troponin i, poc: 0 ng/mL (ref 0.00–0.08)

## 2016-04-26 LAB — POC URINE PREG, ED: Preg Test, Ur: NEGATIVE

## 2016-04-26 MED ORDER — OXYCODONE-ACETAMINOPHEN 5-325 MG PO TABS
1.0000 | ORAL_TABLET | Freq: Once | ORAL | Status: AC
  Administered 2016-04-26: 1 via ORAL
  Filled 2016-04-26: qty 1

## 2016-04-26 MED ORDER — NAPROXEN 500 MG PO TABS
500.0000 mg | ORAL_TABLET | Freq: Once | ORAL | Status: AC
  Administered 2016-04-26: 500 mg via ORAL
  Filled 2016-04-26: qty 1

## 2016-04-26 NOTE — ED Triage Notes (Addendum)
Pt from home via EMS with c/o LUQ abdominal pain with diarrhea x 1 month. Pt states she has GERD. Pt states they have been unable to get her symptoms under control, but she has seen her PCP 2 x in the past month for this.  Pt states she is having left chest pain that does not radiate anywhere. Pt states pain makes it difficult for her to take a deep breath. Pt has clear lung sounds, strong bilateral radial pulses, normal heart sounds, and adequate cap refill. Pt smokes cigarettes, but does not have a history of htn or dm. Pt denies lightheadedness

## 2016-04-26 NOTE — ED Provider Notes (Signed)
[facility]-EMERGENCY DEPT Provider Note   CSN: [id number] Arrival date & time: 04/26/16  2159  By signing my name below, I, Amy Salinas, attest that this documentation has been prepared under the direction and in the presence of TRW AutomotiveKelly Cieara Stierwalt, PA-C.  Electronically Signed: Octavia HeirArianna Salinas, ED Scribe. 04/26/16. 10:36 PM.    History   Chief Complaint Chief Complaint  Patient presents with  . Abdominal Pain  . Chest Pain    The history is provided by the patient. No language interpreter was used.   HPI Comments: Amy Salinas is a 30 y.o. female who has a PMhx of GERD presents to the Emergency Department via EMS complaining of intermittent, gradual worsening, moderate left sided chest pain onset a "few months ago". She describes her pain as sharp and pressure-like. Pain has lasted x 3 hours today. She states having associated subjective numbness in her bilateral legs. She has been evaluated for her pain which she notes the provider diagnosed her with muscle pain. Pt has not taken any medication to alleviate her current symptoms. Pt is currently on the Depo shots for birth control. She denies hematemesis, hemoptysis, vomiting outside of baseline, diarrhea outside of baseline, bowel/bladder incontinence, extremity weakness, or numbness in hands.   Past Medical History:  Diagnosis Date  . GERD (gastroesophageal reflux disease)     There are no active problems to display for this patient.   Past Surgical History:  Procedure Laterality Date  . biopsy of stomach    . INDUCED ABORTION      OB History    No data available       Home Medications    Prior to Admission medications   Medication Sig Start Date End Date Taking? Authorizing Provider  famotidine (PEPCID) 20 MG tablet Take 1 tablet (20 mg total) by mouth 2 (two) times daily. 04/27/16   Antony MaduraKelly Halle Davlin, PA-C  ibuprofen (ADVIL,MOTRIN) 800 MG tablet Take 1 tablet (800 mg total) by mouth every 6 (six) hours as needed. Patient not  taking: Reported on 03/10/2016 12/04/15   Charlestine Nighthristopher Lawyer, PA-C  naproxen (NAPROSYN) 500 MG tablet Take 1 tablet (500 mg total) by mouth 2 (two) times daily. 04/27/16   Antony MaduraKelly Eran Mistry, PA-C  ondansetron (ZOFRAN ODT) 4 MG disintegrating tablet 4mg  ODT q6 hours prn nausea/vomiting 03/10/16   Henrietta HooverLinda C Bernhardt, NP  penicillin v potassium (VEETID) 500 MG tablet Take 2 tablets (1,000 mg total) by mouth 2 (two) times daily. X 7 days Patient not taking: Reported on 03/10/2016 11/29/15   Pricilla LovelessScott Goldston, MD  traMADol (ULTRAM) 50 MG tablet Take 1 tablet (50 mg total) by mouth every 6 (six) hours as needed for severe pain. Patient not taking: Reported on 03/10/2016 12/04/15   Charlestine Nighthristopher Lawyer, PA-C    Family History Family History  Problem Relation Age of Onset  . Hypertension Father   . Diabetes Father     Social History Social History  Substance Use Topics  . Smoking status: Current Every Day Smoker    Packs/day: 0.25    Types: Cigarettes  . Smokeless tobacco: Never Used  . Alcohol use Yes     Comment: occ     Allergies   Other   Review of Systems Review of Systems A complete 10 system review of systems was obtained and all systems are negative except as noted in the HPI and PMH.     Physical Exam Updated Vital Signs BP 134/90   Pulse 83   Temp 99 F (37.2 C) (  Oral)   Resp 24   Ht 5' (1.524 m)   Wt 81.6 kg   LMP 04/26/2016   SpO2 100%   BMI 35.15 kg/m   Physical Exam  Constitutional: She is oriented to person, place, and time. She appears well-developed and well-nourished. No distress.  Nontoxic appearing and in no distress  HENT:  Head: Normocephalic and atraumatic.  Eyes: Conjunctivae and EOM are normal. No scleral icterus.  Neck: Normal range of motion.  Cardiovascular: Normal rate, regular rhythm and intact distal pulses.   Pulmonary/Chest: Effort normal. No respiratory distress. She has no wheezes. She has no rales. She exhibits no tenderness.  No reproducible chest  wall tenderness. No bony deformity or crepitus.  Abdominal: Soft. She exhibits no distension and no mass. There is no tenderness. There is no guarding.  Soft, nontender abdomen. No masses. No peritoneal signs.  Musculoskeletal: Normal range of motion.  Neurological: She is alert and oriented to person, place, and time.  GCS 15. Patient moving all extremities.  Skin: Skin is warm and dry. No rash noted. She is not diaphoretic. No erythema. No pallor.  Psychiatric: She has a normal mood and affect. Her behavior is normal.  Nursing note and vitals reviewed.    ED Treatments / Results  DIAGNOSTIC STUDIES: Oxygen Saturation is 100% on RA, normal by my interpretation.  COORDINATION OF CARE:  10:34 PM Discussed treatment plan with pt at bedside and pt agreed to plan.  Labs (all labs ordered are listed, but only abnormal results are displayed) Labs Reviewed  BASIC METABOLIC PANEL - Abnormal; Notable for the following:       Result Value   Glucose, Bld 105 (*)    All other components within normal limits  CBC - Abnormal; Notable for the following:    Hemoglobin 11.3 (*)    HCT 33.1 (*)    MCV 69.8 (*)    MCH 23.8 (*)    All other components within normal limits  D-DIMER, QUANTITATIVE (NOT AT [facility])  Rosezena Sensor, ED  POC URINE PREG, ED    EKG  EKG Interpretation  Date/Time:  Saturday April 26 2016 22:28:03 EDT Ventricular Rate:  69 PR Interval:    QRS Duration: 96 QT Interval:  378 QTC Calculation: 405 R Axis:   64 Text Interpretation:  Sinus rhythm Borderline T abnormalities, anterior leads Confirmed by Ranae Palms  MD, DAVID ([id number]) on 04/26/2016 11:11:00 PM       Radiology Dg Chest 2 View  Result Date: 04/26/2016 CLINICAL DATA:  Left-sided chest pain and shortness breath for 2 months. Gastroesophageal reflux disease. EXAM: CHEST  2 VIEW COMPARISON:  11/29/2015 FINDINGS: The heart size and mediastinal contours are within normal limits. Both lungs are clear. The  visualized skeletal structures are unremarkable. IMPRESSION: Negative.  No active cardiopulmonary disease. Electronically Signed   By: Myles Rosenthal M.D.   On: 04/26/2016 23:25    Procedures Procedures (including critical care time)  Medications Ordered in ED Medications  naproxen (NAPROSYN) tablet 500 mg (500 mg Oral Given 04/26/16 2248)  oxyCODONE-acetaminophen (PERCOCET/ROXICET) 5-325 MG per tablet 1 tablet (1 tablet Oral Given 04/26/16 2248)     Initial Impression / Assessment and Plan / ED Course  I have reviewed the triage vital signs and the nursing notes.  Pertinent labs & imaging results that were available during my care of the patient were reviewed by me and considered in my medical decision making (see chart for details).  Clinical Course    30 year old  female parents to the emergency department for evaluation of chronic pleuritic left-sided chest pain. She has been evaluated by her primary care doctor on 2 occasions for similar symptoms. She reports constant pain for the past 3 hours. Patient with clear lung sounds. She has no hypoxia. Patient also afebrile with stable vital signs. Laboratory workup is noncontributory. Troponin is negative. D-dimer is also negative and patient is, overall, low risk for pulmonary embolus.  Chest x-ray performed which shows no evidence of focal consolidation, pneumonia, rib fracture, or pneumothorax. Given chronicity of symptoms and reassuring exam, I do not believe further emergent workup is indicated at this time. Will manage supportively with naproxen as symptoms likely musculoskeletal in etiology. Patient also given a prescription for Pepcid for any atypical reflux related symptoms. She has been instructed to follow-up with her primary care provider regarding your visit today. Patient discharged in stable condition with no unaddressed concerns. Return precautions given.   Final Clinical Impressions(s) / ED Diagnoses   Final diagnoses:  Pleuritic  chest pain    I personally performed the services described in this documentation, which was scribed in my presence. The recorded information has been reviewed and is accurate.    New Prescriptions Discharge Medication List as of 04/27/2016 12:32 AM    START taking these medications   Details  famotidine (PEPCID) 20 MG tablet Take 1 tablet (20 mg total) by mouth 2 (two) times daily., Starting Sun 04/27/2016, Print         [place], PA-C 04/27/16 1610    Loren Racer, MD 04/27/16 [phone]

## 2016-04-26 NOTE — ED Notes (Signed)
Bed: WA07 Expected date:  Expected time:  Means of arrival:  Comments: EMS/abd. pain 

## 2016-04-27 MED ORDER — NAPROXEN 500 MG PO TABS: 500.0000 mg | ORAL_TABLET | Freq: Two times a day (BID) | ORAL | 0 refills | Status: DC

## 2016-04-27 MED ORDER — FAMOTIDINE 20 MG PO TABS: 20.0000 mg | ORAL_TABLET | Freq: Two times a day (BID) | ORAL | 0 refills | Status: DC

## 2016-04-27 NOTE — Discharge Instructions (Signed)
Take Pepcid as prescribed and Naproxen as needed for persistent pain. Follow up with your primary care doctor regarding your ED visit today.

## 2016-07-10 ENCOUNTER — Emergency Department (HOSPITAL_COMMUNITY)
Admission: EM | Admit: 2016-07-10 | Discharge: 2016-07-11 | Disposition: A | Discharge status: Home / Self Care | Payer: Medicaid Other | Attending: Emergency Medicine | Admitting: Emergency Medicine

## 2016-07-10 ENCOUNTER — Encounter (HOSPITAL_COMMUNITY): Payer: Self-pay | Admitting: Emergency Medicine

## 2016-07-10 DIAGNOSIS — Y999 Unspecified external cause status: Secondary | ICD-10-CM | POA: Insufficient documentation

## 2016-07-10 DIAGNOSIS — F17210 Nicotine dependence, cigarettes, uncomplicated: Secondary | ICD-10-CM | POA: Insufficient documentation

## 2016-07-10 DIAGNOSIS — Y939 Activity, unspecified: Secondary | ICD-10-CM | POA: Insufficient documentation

## 2016-07-10 DIAGNOSIS — R1084 Generalized abdominal pain: Secondary | ICD-10-CM

## 2016-07-10 DIAGNOSIS — X58XXXA Exposure to other specified factors, initial encounter: Secondary | ICD-10-CM | POA: Insufficient documentation

## 2016-07-10 DIAGNOSIS — Y929 Unspecified place or not applicable: Secondary | ICD-10-CM | POA: Insufficient documentation

## 2016-07-10 DIAGNOSIS — K625 Hemorrhage of anus and rectum: Secondary | ICD-10-CM | POA: Insufficient documentation

## 2016-07-10 DIAGNOSIS — T192XXA Foreign body in vulva and vagina, initial encounter: Secondary | ICD-10-CM | POA: Insufficient documentation

## 2016-07-10 DIAGNOSIS — Z79899 Other long term (current) drug therapy: Secondary | ICD-10-CM | POA: Insufficient documentation

## 2016-07-10 LAB — COMPREHENSIVE METABOLIC PANEL
ALT: 12 U/L — ABNORMAL LOW (ref 14–54)
ANION GAP: 7 (ref 5–15)
AST: 14 U/L — ABNORMAL LOW (ref 15–41)
Albumin: 4.6 g/dL (ref 3.5–5.0)
Alkaline Phosphatase: 46 U/L (ref 38–126)
BILIRUBIN TOTAL: 0.9 mg/dL (ref 0.3–1.2)
BUN: 9 mg/dL (ref 6–20)
CO2: 24 mmol/L (ref 22–32)
Calcium: 9.3 mg/dL (ref 8.9–10.3)
Chloride: 107 mmol/L (ref 101–111)
Creatinine, Ser: 0.78 mg/dL (ref 0.44–1.00)
GFR calc Af Amer: >60 mL/min (ref >60)
Glucose, Bld: 125 mg/dL — ABNORMAL HIGH (ref 65–99)
POTASSIUM: 3.5 mmol/L (ref 3.5–5.1)
Sodium: 138 mmol/L (ref 135–145)
TOTAL PROTEIN: 7.8 g/dL (ref 6.5–8.1)

## 2016-07-10 LAB — CBC
HEMATOCRIT: 36 % (ref 36.0–46.0)
HEMOGLOBIN: 11.8 g/dL — AB (ref 12.0–15.0)
MCH: 23.6 pg — ABNORMAL LOW (ref 26.0–34.0)
MCHC: 32.8 g/dL (ref 30.0–36.0)
MCV: 72 fL — ABNORMAL LOW (ref 78.0–100.0)
Platelets: 277 10*3/uL (ref 150–400)
RBC: 5 MIL/uL (ref 3.87–5.11)
RDW: 15.4 % (ref 11.5–15.5)
WBC: 7.7 10*3/uL (ref 4.0–10.5)

## 2016-07-10 LAB — LIPASE, BLOOD: Lipase: 16 U/L (ref 11–51)

## 2016-07-10 LAB — I-STAT BETA HCG BLOOD, ED (MC, WL, AP ONLY): I-stat hCG, quantitative: <5 m[IU]/mL (ref <5)

## 2016-07-10 NOTE — ED Triage Notes (Signed)
Pt comes with complaints of upper abdominal pain on left and right side.  Endorses nausea, vomiting, and diarrhea starting this morning.  Patient also states she lost a condom inside of her about an hour ago.  Stated the lower part of it came out but she could not feel/retrieve the top part. States she feels very uncomfortable from it. Dad at bedside (does not want him knowing about the condom).

## 2016-07-10 NOTE — ED Triage Notes (Signed)
Pt states she has also had rectal bleeding that started today.

## 2016-07-10 NOTE — ED Provider Notes (Signed)
[facility]-EMERGENCY DEPT Provider Note   CSN: [id number] Arrival date & time: 07/10/16  2112  By signing my name below, I, Soijett Blue, attest that this documentation has been prepared under the direction and in the presence of Derwood KaplanAnkit Brinlyn Cena, MD. Electronically Signed: Soijett Blue, ED Scribe. 07/10/16. 12:13 AM.   History   Chief Complaint Chief Complaint  Patient presents with  . Abdominal Pain  . Foreign Object In Body  . Rectal Bleeding    HPI Amy Salinas is a 30 y.o. female with a PMHx of IBS, GERD, who presents to the Emergency Department complaining of constant generalized abdominal pain onset 6 AM this mornng. Pt states that she has had generalized abdominal pain in the past and was evaluated by a specialist and informed that she had IBS. Pt reports that she had a colonoscopy and endoscopy completed with [place] of [place] stomach [place]. Pt reports that she is experiencing generalized abdominal pain every couple days. Pt denies recent suspicious food intake. She states that she is having associated symptoms of nausea, diarrhea x 10 episodes today, and bright red blood in stool x 2 hours ago. She states that she has not tried any medications for the relief for her symptoms. She denies vaginal bleeding, vaginal discharge, weight loss, rash, joint pain, and any other symptoms. Pt denies a PMHx of DM, HTN, gastric bleeds, or abdominal surgeries. Pt denies pregnancy at this time.    Pt secondarily complains of a foreign object in her body onset 2 hours ago. Pt states that she was having sexual intercourse and noted that following sexual intercourse the condom was still inside her vagina. Pt notes that she was able to remove part of the condom, but there is still a fragment left that she is unable to retrieve. Pt denies any other symptoms at this time.   The history is provided by the patient. No language interpreter was used.    Past Medical History:  Diagnosis Date  .  GERD (gastroesophageal reflux disease)     There are no active problems to display for this patient.   Past Surgical History:  Procedure Laterality Date  . biopsy of stomach    . INDUCED ABORTION      OB History    No data available       Home Medications    Prior to Admission medications   Medication Sig Start Date End Date Taking? Authorizing Provider  ciprofloxacin (CIPRO) 500 MG tablet Take 1 tablet (500 mg total) by mouth every 12 (twelve) hours. 07/11/16   Derwood KaplanAnkit Zani Kyllonen, MD  famotidine (PEPCID) 20 MG tablet Take 1 tablet (20 mg total) by mouth 2 (two) times daily. Patient not taking: Reported on 07/10/2016 04/27/16   Antony MaduraKelly Humes, PA-C  ibuprofen (ADVIL,MOTRIN) 800 MG tablet Take 1 tablet (800 mg total) by mouth every 6 (six) hours as needed. Patient not taking: Reported on 03/10/2016 12/04/15   Charlestine Nighthristopher Lawyer, PA-C  metroNIDAZOLE (FLAGYL) 500 MG tablet Take 1 tablet (500 mg total) by mouth 2 (two) times daily. 07/11/16   Derwood KaplanAnkit Cebastian Neis, MD  naproxen (NAPROSYN) 500 MG tablet Take 1 tablet (500 mg total) by mouth 2 (two) times daily. Patient not taking: Reported on 07/10/2016 04/27/16   Antony MaduraKelly Humes, PA-C  ondansetron (ZOFRAN ODT) 4 MG disintegrating tablet 4mg  ODT q6 hours prn nausea/vomiting Patient not taking: Reported on 07/10/2016 03/10/16   Henrietta HooverLinda C Bernhardt, NP  penicillin v potassium (VEETID) 500 MG tablet Take 2 tablets (  1,000 mg total) by mouth 2 (two) times daily. X 7 days Patient not taking: Reported on 03/10/2016 11/29/15   Pricilla LovelessScott Goldston, MD  traMADol (ULTRAM) 50 MG tablet Take 1 tablet (50 mg total) by mouth every 6 (six) hours as needed for severe pain. Patient not taking: Reported on 03/10/2016 12/04/15   Charlestine Nighthristopher Lawyer, PA-C    Family History Family History  Problem Relation Age of Onset  . Hypertension Father   . Diabetes Father     Social History Social History  Substance Use Topics  . Smoking status: Current Every Day Smoker    Packs/day: 0.25     Types: Cigarettes  . Smokeless tobacco: Never Used  . Alcohol use Yes     Comment: occ     Allergies   Other   Review of Systems Review of Systems A complete 10 system review of systems was obtained and all systems are negative except as noted in the HPI and PMH.   Physical Exam Updated Vital Signs BP 116/79 (BP Location: Left Arm)   Pulse 93   Temp 98.4 F (36.9 C) (Oral)   Resp 16   Ht 5\' 1"  (1.549 m)   Wt 180 lb (81.6 kg)   SpO2 100%   BMI 34.01 kg/m   Physical Exam  Constitutional: She is oriented to person, place, and time. She appears well-developed and well-nourished. No distress.  HENT:  Head: Normocephalic and atraumatic.  Eyes: Conjunctivae and EOM are normal. Pupils are equal, round, and reactive to light.  Neck: Normal range of motion. Neck supple.  Cardiovascular: Normal rate, regular rhythm, normal heart sounds and intact distal pulses.  Exam reveals no gallop and no friction rub.   No murmur heard. Pulmonary/Chest: Effort normal and breath sounds normal. No respiratory distress. She has no wheezes. She has no rales.  Abdominal: Soft. She exhibits no distension. Bowel sounds are increased. There is generalized tenderness and tenderness in the periumbilical area. There is no rebound and no guarding.  Positive hyperactive bowel sounds. Generalized abdominal tenderness. Tenderness worse in the periumbilical region. No rebound or guarding.   Genitourinary: Vagina normal and uterus normal.  Genitourinary Comments: External exam - normal, no lesions Speculum exam: Pt has some white discharge, no blood Bimanual exam: Patient has no CMT, no adnexal tenderness or fullness and cervical os is closed  Musculoskeletal: Normal range of motion.  Neurological: She is alert and oriented to person, place, and time.  Skin: Skin is warm and dry.  Psychiatric: She has a normal mood and affect. Her behavior is normal.  Nursing note and vitals reviewed.    ED Treatments /  Results  DIAGNOSTIC STUDIES: Oxygen Saturation is 98% on RA, nl by my interpretation.    COORDINATION OF CARE: 12:04 AM Discussed treatment plan with pt at bedside which includes labs, UA, FB removal, and pt agreed to plan.   Labs (all labs ordered are listed, but only abnormal results are displayed) Labs Reviewed  WET PREP, GENITAL - Abnormal; Notable for the following:       Result Value   WBC, Wet Prep HPF POC MODERATE (*)    All other components within normal limits  COMPREHENSIVE METABOLIC PANEL - Abnormal; Notable for the following:    Glucose, Bld 125 (*)    AST 14 (*)    ALT 12 (*)    All other components within normal limits  CBC - Abnormal; Notable for the following:    Hemoglobin 11.8 (*)  MCV 72.0 (*)    MCH 23.6 (*)    All other components within normal limits  URINALYSIS, ROUTINE W REFLEX MICROSCOPIC (NOT AT [facility]) - Abnormal; Notable for the following:    Leukocytes, UA SMALL (*)    All other components within normal limits  URINE MICROSCOPIC-ADD ON - Abnormal; Notable for the following:    Squamous Epithelial / LPF 0-5 (*)    Bacteria, UA MANY (*)    All other components within normal limits  LIPASE, BLOOD  I-STAT BETA HCG BLOOD, ED ([facility], [facility], [facility] ONLY)  GC/CHLAMYDIA PROBE AMP ([place]) NOT AT [facility]    EKG  EKG Interpretation None       Radiology No results found.  Procedures .Foreign Body Removal Date/Time: 07/11/2016 1:12 AM Performed by: Derwood Kaplan Authorized by: Derwood Kaplan  Consent: Verbal consent obtained. Risks and benefits: risks, benefits and alternatives were discussed Consent given by: patient Patient understanding: patient states understanding of the procedure being performed Patient consent: the patient's understanding of the procedure matches consent given Patient identity confirmed: arm band Time out: Immediately prior to procedure a "time out" was called to verify the correct patient, procedure, equipment, support  staff and site/side marked as required. Body area: vagina Localization method: speculum Removal mechanism: forceps Complexity: simple 1 objects recovered. Objects recovered: condom piece Post-procedure assessment: foreign body removed Patient tolerance: Patient tolerated the procedure well with no immediate complications   (including critical care time)  Medications Ordered in ED Medications  naproxen (NAPROSYN) tablet 500 mg (500 mg Oral Given 07/11/16 0030)  HYDROcodone-acetaminophen (NORCO/VICODIN) 5-325 MG per tablet 1 tablet (1 tablet Oral Given 07/11/16 0143)     Initial Impression / Assessment and Plan / ED Course  I have reviewed the triage vital signs and the nursing notes.  Pertinent labs & imaging results that were available during my care of the patient were reviewed by me and considered in my medical decision making (see chart for details).  Clinical Course     I personally performed the services described in this documentation, which was scribed in my presence. The recorded information has been reviewed and is accurate.  Pt's main complain is vaginal foreign body. She also complains of intermittent abd pain and new diarrhea - some blood mixed in it. Abd exam is benign. No recent antibiotics and labs are reassuring. Doubt bacterial colitis - antibiotics given for wait and watch approach. Pt also advised to see Gi doctor. Pelvic exam was reassuring.   Final Clinical Impressions(s) / ED Diagnoses   Final diagnoses:  Generalized abdominal pain  Vaginal foreign body, initial encounter  Rectal bleeding    New Prescriptions New Prescriptions   CIPROFLOXACIN (CIPRO) 500 MG TABLET    Take 1 tablet (500 mg total) by mouth every 12 (twelve) hours.   METRONIDAZOLE (FLAGYL) 500 MG TABLET    Take 1 tablet (500 mg total) by mouth 2 (two) times daily.       Derwood Kaplan, MD 07/11/16 [phone]

## 2016-07-11 LAB — URINALYSIS, ROUTINE W REFLEX MICROSCOPIC
BILIRUBIN URINE: NEGATIVE
Glucose, UA: NEGATIVE mg/dL
HGB URINE DIPSTICK: NEGATIVE
KETONES UR: NEGATIVE mg/dL
Nitrite: NEGATIVE
PROTEIN: NEGATIVE mg/dL
Specific Gravity, Urine: 1.025 (ref 1.005–1.030)
pH: 6 (ref 5.0–8.0)

## 2016-07-11 LAB — WET PREP, GENITAL
Clue Cells Wet Prep HPF POC: NONE SEEN
Sperm: NONE SEEN
Trich, Wet Prep: NONE SEEN
YEAST WET PREP: NONE SEEN

## 2016-07-11 LAB — URINE MICROSCOPIC-ADD ON

## 2016-07-11 MED ORDER — METRONIDAZOLE 500 MG PO TABS: 500.0000 mg | ORAL_TABLET | Freq: Two times a day (BID) | ORAL | 0 refills | Status: DC

## 2016-07-11 MED ORDER — CIPROFLOXACIN HCL 500 MG PO TABS: 500.0000 mg | ORAL_TABLET | Freq: Two times a day (BID) | ORAL | 0 refills | Status: DC

## 2016-07-11 MED ORDER — HYDROCODONE-ACETAMINOPHEN 5-325 MG PO TABS
1.0000 | ORAL_TABLET | Freq: Once | ORAL | Status: AC
  Administered 2016-07-11: 1 via ORAL
  Filled 2016-07-11: qty 1

## 2016-07-11 MED ORDER — NAPROXEN 500 MG PO TABS
500.0000 mg | ORAL_TABLET | Freq: Once | ORAL | Status: AC
  Administered 2016-07-11: 500 mg via ORAL
  Filled 2016-07-11: qty 1

## 2016-07-11 NOTE — Discharge Instructions (Signed)
As discussed - take the antibiotics only if you develope fevers or worsening of the bloody stools. In fact consider returning to the ER if the bloody stools get worse.  We recommend that you see a GI doctor for the on going pain.

## 2016-07-14 LAB — GC/CHLAMYDIA PROBE AMP (~~LOC~~) NOT AT ARMC
Chlamydia: NEGATIVE
Neisseria Gonorrhea: NEGATIVE

## 2016-09-08 ENCOUNTER — Encounter (HOSPITAL_COMMUNITY): Payer: Self-pay | Admitting: Emergency Medicine

## 2016-09-08 ENCOUNTER — Emergency Department (HOSPITAL_COMMUNITY)
Admission: EM | Admit: 2016-09-08 | Discharge: 2016-09-08 | Disposition: A | Discharge status: Home / Self Care | Payer: Medicaid Other | Attending: Emergency Medicine | Admitting: Emergency Medicine

## 2016-09-08 DIAGNOSIS — F17210 Nicotine dependence, cigarettes, uncomplicated: Secondary | ICD-10-CM | POA: Insufficient documentation

## 2016-09-08 DIAGNOSIS — R1084 Generalized abdominal pain: Secondary | ICD-10-CM | POA: Insufficient documentation

## 2016-09-08 DIAGNOSIS — Z79899 Other long term (current) drug therapy: Secondary | ICD-10-CM | POA: Insufficient documentation

## 2016-09-08 LAB — URINALYSIS, ROUTINE W REFLEX MICROSCOPIC
BILIRUBIN URINE: NEGATIVE
Glucose, UA: NEGATIVE mg/dL
Hgb urine dipstick: NEGATIVE
KETONES UR: NEGATIVE mg/dL
LEUKOCYTES UA: NEGATIVE
NITRITE: NEGATIVE
PH: 6 (ref 5.0–8.0)
PROTEIN: NEGATIVE mg/dL
Specific Gravity, Urine: 1.017 (ref 1.005–1.030)

## 2016-09-08 LAB — COMPREHENSIVE METABOLIC PANEL
ALBUMIN: 4.9 g/dL (ref 3.5–5.0)
ALK PHOS: 52 U/L (ref 38–126)
ALT: 13 U/L — ABNORMAL LOW (ref 14–54)
AST: 17 U/L (ref 15–41)
Anion gap: 7 (ref 5–15)
BILIRUBIN TOTAL: 0.4 mg/dL (ref 0.3–1.2)
BUN: 13 mg/dL (ref 6–20)
CALCIUM: 9.3 mg/dL (ref 8.9–10.3)
CO2: 22 mmol/L (ref 22–32)
CREATININE: 0.78 mg/dL (ref 0.44–1.00)
Chloride: 106 mmol/L (ref 101–111)
GFR calc Af Amer: >60 mL/min (ref >60)
GLUCOSE: 84 mg/dL (ref 65–99)
POTASSIUM: 4.1 mmol/L (ref 3.5–5.1)
Sodium: 135 mmol/L (ref 135–145)
TOTAL PROTEIN: 8.4 g/dL — AB (ref 6.5–8.1)

## 2016-09-08 LAB — CBC
HCT: 38.3 % (ref 36.0–46.0)
Hemoglobin: 12.5 g/dL (ref 12.0–15.0)
MCH: 22.9 pg — ABNORMAL LOW (ref 26.0–34.0)
MCHC: 32.6 g/dL (ref 30.0–36.0)
MCV: 70.1 fL — ABNORMAL LOW (ref 78.0–100.0)
PLATELETS: 281 10*3/uL (ref 150–400)
RBC: 5.46 MIL/uL — ABNORMAL HIGH (ref 3.87–5.11)
RDW: 15.3 % (ref 11.5–15.5)
WBC: 7.1 10*3/uL (ref 4.0–10.5)

## 2016-09-08 LAB — LIPASE, BLOOD: Lipase: 30 U/L (ref 11–51)

## 2016-09-08 LAB — I-STAT BETA HCG BLOOD, ED (MC, WL, AP ONLY): I-stat hCG, quantitative: <5 m[IU]/mL (ref <5)

## 2016-09-08 MED ORDER — ACETAMINOPHEN-CODEINE #3 300-30 MG PO TABS: 1.0000 | ORAL_TABLET | Freq: Four times a day (QID) | ORAL | 0 refills | Status: DC | PRN

## 2016-09-08 MED ORDER — DICYCLOMINE HCL 20 MG PO TABS: 20.0000 mg | ORAL_TABLET | Freq: Two times a day (BID) | ORAL | 0 refills | Status: DC

## 2016-09-08 NOTE — ED Provider Notes (Signed)
[facility] DEPT Provider Note   CSN: [id number] Arrival date & time: 09/08/16  1309     History   Chief Complaint Chief Complaint  Patient presents with  . Abdominal Pain    HPI Amy Salinas is a 31 y.o. female.  31 year old female who's had a long-standing history of chronic waxing and waning watery diarrhea as well as nonbilious emesis. No associated fever or chills. States that she woke this morning with these symptoms. No association with food. Has been seen by gastroenterology before without a diagnosis. Denies any urinary symptoms. She is currently pain-free. Nothing makes her symptoms better or worse. No treatment use prior to arrival.      Past Medical History:  Diagnosis Date  . GERD (gastroesophageal reflux disease)     There are no active problems to display for this patient.   Past Surgical History:  Procedure Laterality Date  . biopsy of stomach    . INDUCED ABORTION      OB History    No data available       Home Medications    Prior to Admission medications   Medication Sig Start Date End Date Taking? Authorizing Provider  ciprofloxacin (CIPRO) 500 MG tablet Take 1 tablet (500 mg total) by mouth every 12 (twelve) hours. 07/11/16   Derwood KaplanAnkit Nanavati, MD  famotidine (PEPCID) 20 MG tablet Take 1 tablet (20 mg total) by mouth 2 (two) times daily. Patient not taking: Reported on 07/10/2016 04/27/16   Antony MaduraKelly Humes, PA-C  ibuprofen (ADVIL,MOTRIN) 800 MG tablet Take 1 tablet (800 mg total) by mouth every 6 (six) hours as needed. Patient not taking: Reported on 03/10/2016 12/04/15   Charlestine Nighthristopher Lawyer, PA-C  metroNIDAZOLE (FLAGYL) 500 MG tablet Take 1 tablet (500 mg total) by mouth 2 (two) times daily. 07/11/16   Derwood KaplanAnkit Nanavati, MD  naproxen (NAPROSYN) 500 MG tablet Take 1 tablet (500 mg total) by mouth 2 (two) times daily. Patient not taking: Reported on 07/10/2016 04/27/16   Antony MaduraKelly Humes, PA-C  ondansetron (ZOFRAN ODT) 4 MG disintegrating tablet 4mg  ODT q6  hours prn nausea/vomiting Patient not taking: Reported on 07/10/2016 03/10/16   Henrietta HooverLinda C Bernhardt, NP  penicillin v potassium (VEETID) 500 MG tablet Take 2 tablets (1,000 mg total) by mouth 2 (two) times daily. X 7 days Patient not taking: Reported on 03/10/2016 11/29/15   Pricilla LovelessScott Goldston, MD  traMADol (ULTRAM) 50 MG tablet Take 1 tablet (50 mg total) by mouth every 6 (six) hours as needed for severe pain. Patient not taking: Reported on 03/10/2016 12/04/15   Charlestine Nighthristopher Lawyer, PA-C    Family History Family History  Problem Relation Age of Onset  . Hypertension Father   . Diabetes Father     Social History Social History  Substance Use Topics  . Smoking status: Current Every Day Smoker    Packs/day: 0.25    Types: Cigarettes  . Smokeless tobacco: Never Used  . Alcohol use Yes     Comment: occ     Allergies   Other   Review of Systems Review of Systems  All other systems reviewed and are negative.    Physical Exam Updated Vital Signs BP 123/83 (BP Location: Left Arm)   Pulse 80   Temp 99.7 F (37.6 C) (Oral)   Resp 18   Wt 82.6 kg   LMP 08/25/2016   SpO2 100%   BMI 34.39 kg/m   Physical Exam  Constitutional: She is oriented to person, place, and time. She appears well-developed  and well-nourished.  Non-toxic appearance. No distress.  HENT:  Head: Normocephalic and atraumatic.  Eyes: Conjunctivae, EOM and lids are normal. Pupils are equal, round, and reactive to light.  Neck: Normal range of motion. Neck supple. No tracheal deviation present. No thyroid mass present.  Cardiovascular: Normal rate, regular rhythm and normal heart sounds.  Exam reveals no gallop.   No murmur heard. Pulmonary/Chest: Effort normal and breath sounds normal. No stridor. No respiratory distress. She has no decreased breath sounds. She has no wheezes. She has no rhonchi. She has no rales.  Abdominal: Soft. Normal appearance and bowel sounds are normal. She exhibits no distension. There is no  tenderness. There is no rebound and no CVA tenderness.  Musculoskeletal: Normal range of motion. She exhibits no edema or tenderness.  Neurological: She is alert and oriented to person, place, and time. She has normal strength. No cranial nerve deficit or sensory deficit. GCS eye subscore is 4. GCS verbal subscore is 5. GCS motor subscore is 6.  Skin: Skin is warm and dry. No abrasion and no rash noted.  Psychiatric: She has a normal mood and affect. Her speech is normal and behavior is normal.  Nursing note and vitals reviewed.    ED Treatments / Results  Labs (all labs ordered are listed, but only abnormal results are displayed) Labs Reviewed  COMPREHENSIVE METABOLIC PANEL - Abnormal; Notable for the following:       Result Value   Total Protein 8.4 (*)    ALT 13 (*)    All other components within normal limits  CBC - Abnormal; Notable for the following:    RBC 5.46 (*)    MCV 70.1 (*)    MCH 22.9 (*)    All other components within normal limits  LIPASE, BLOOD  URINALYSIS, ROUTINE W REFLEX MICROSCOPIC  I-STAT BETA HCG BLOOD, ED ([facility], [facility], [facility] ONLY)    EKG  EKG Interpretation None       Radiology No results found.  Procedures Procedures (including critical care time)  Medications Ordered in ED Medications - No data to display   Initial Impression / Assessment and Plan / ED Course  I have reviewed the triage vital signs and the nursing notes.  Pertinent labs & imaging results that were available during my care of the patient were reviewed by me and considered in my medical decision making (see chart for details).  Clinical Course     Patient with likely IBS and will be given GI referral. Return precautions given  Final Clinical Impressions(s) / ED Diagnoses   Final diagnoses:  None    New Prescriptions New Prescriptions   No medications on file     Lorre Nick, MD 09/08/16 1715

## 2016-09-08 NOTE — ED Triage Notes (Signed)
Pt complaint of abdominal pain with associated n/v/d onset last night.

## 2016-09-08 NOTE — ED Notes (Signed)
Bed: WA08 Expected date:  Expected time:  Means of arrival:  Comments: 

## 2017-01-07 ENCOUNTER — Emergency Department (HOSPITAL_COMMUNITY)
Admission: EM | Admit: 2017-01-07 | Discharge: 2017-01-07 | Disposition: A | Discharge status: Home / Self Care | Payer: Medicaid Other | Attending: Emergency Medicine | Admitting: Emergency Medicine

## 2017-01-07 ENCOUNTER — Encounter (HOSPITAL_COMMUNITY): Payer: Self-pay

## 2017-01-07 DIAGNOSIS — R197 Diarrhea, unspecified: Secondary | ICD-10-CM

## 2017-01-07 DIAGNOSIS — R112 Nausea with vomiting, unspecified: Secondary | ICD-10-CM

## 2017-01-07 DIAGNOSIS — R1012 Left upper quadrant pain: Secondary | ICD-10-CM | POA: Insufficient documentation

## 2017-01-07 DIAGNOSIS — F17210 Nicotine dependence, cigarettes, uncomplicated: Secondary | ICD-10-CM | POA: Insufficient documentation

## 2017-01-07 LAB — URINALYSIS, ROUTINE W REFLEX MICROSCOPIC
BILIRUBIN URINE: NEGATIVE
Glucose, UA: NEGATIVE mg/dL
Hgb urine dipstick: NEGATIVE
Ketones, ur: NEGATIVE mg/dL
Leukocytes, UA: NEGATIVE
NITRITE: NEGATIVE
PH: 6 (ref 5.0–8.0)
Protein, ur: NEGATIVE mg/dL
SPECIFIC GRAVITY, URINE: 1.012 (ref 1.005–1.030)

## 2017-01-07 LAB — COMPREHENSIVE METABOLIC PANEL
ALK PHOS: 44 U/L (ref 38–126)
ALT: 14 U/L (ref 14–54)
AST: 14 U/L — ABNORMAL LOW (ref 15–41)
Albumin: 4.7 g/dL (ref 3.5–5.0)
Anion gap: 6 (ref 5–15)
BILIRUBIN TOTAL: 0.3 mg/dL (ref 0.3–1.2)
BUN: 12 mg/dL (ref 6–20)
CALCIUM: 9.4 mg/dL (ref 8.9–10.3)
CO2: 23 mmol/L (ref 22–32)
CREATININE: 0.76 mg/dL (ref 0.44–1.00)
Chloride: 108 mmol/L (ref 101–111)
Glucose, Bld: 89 mg/dL (ref 65–99)
Potassium: 3.9 mmol/L (ref 3.5–5.1)
Sodium: 137 mmol/L (ref 135–145)
Total Protein: 8.1 g/dL (ref 6.5–8.1)

## 2017-01-07 LAB — CBC
HCT: 36.1 % (ref 36.0–46.0)
Hemoglobin: 11.9 g/dL — ABNORMAL LOW (ref 12.0–15.0)
MCH: 23.6 pg — ABNORMAL LOW (ref 26.0–34.0)
MCHC: 33 g/dL (ref 30.0–36.0)
MCV: 71.6 fL — ABNORMAL LOW (ref 78.0–100.0)
Platelets: 238 10*3/uL (ref 150–400)
RBC: 5.04 MIL/uL (ref 3.87–5.11)
RDW: 15.2 % (ref 11.5–15.5)
WBC: 5.4 10*3/uL (ref 4.0–10.5)

## 2017-01-07 LAB — LIPASE, BLOOD: Lipase: 26 U/L (ref 11–51)

## 2017-01-07 LAB — PREGNANCY, URINE: Preg Test, Ur: NEGATIVE

## 2017-01-07 MED ORDER — RANITIDINE HCL 150 MG PO CAPS: 150.0000 mg | ORAL_CAPSULE | Freq: Every day | ORAL | 0 refills | Status: DC

## 2017-01-07 MED ORDER — GI COCKTAIL ~~LOC~~
30.0000 mL | Freq: Once | ORAL | Status: AC
  Administered 2017-01-07: 30 mL via ORAL
  Filled 2017-01-07: qty 30

## 2017-01-07 MED ORDER — ONDANSETRON 4 MG PO TBDP: 4.0000 mg | ORAL_TABLET | Freq: Three times a day (TID) | ORAL | 0 refills | Status: AC | PRN

## 2017-01-07 NOTE — ED Triage Notes (Signed)
Pt complains of lower abd pain with n,v,d since last night

## 2017-01-07 NOTE — ED Provider Notes (Signed)
[facility] Provider Note   CSN: [id number] Arrival date & time: 01/07/17  1502     History   Chief Complaint Chief Complaint  Patient presents with  . Abdominal Pain    HPI Amy Salinas is a 31 y.o. female.  The history is provided by the patient.  Abdominal Pain   This is a new problem. The current episode started yesterday. The problem occurs constantly. Progression since onset: fluctuating. The pain is associated with eating. The pain is located in the periumbilical region. The quality of the pain is cramping and sharp. The pain is moderate. Associated symptoms include diarrhea, nausea and vomiting. Pertinent negatives include anorexia, fever, hematochezia and melena. The symptoms are aggravated by eating. Nothing relieves the symptoms.    Past Medical History:  Diagnosis Date  . GERD (gastroesophageal reflux disease)     There are no active problems to display for this patient.   Past Surgical History:  Procedure Laterality Date  . biopsy of stomach    . INDUCED ABORTION      OB History    No data available       Home Medications    Prior to Admission medications   Medication Sig Start Date End Date Taking? Authorizing Provider  acetaminophen-codeine (TYLENOL #3) 300-30 MG tablet Take 1 tablet by mouth every 6 (six) hours as needed for moderate pain. Patient not taking: Reported on 01/07/2017 09/08/16   Lorre Nick, MD  ciprofloxacin (CIPRO) 500 MG tablet Take 1 tablet (500 mg total) by mouth every 12 (twelve) hours. Patient not taking: Reported on 01/07/2017 07/11/16   Derwood Kaplan, MD  dicyclomine (BENTYL) 20 MG tablet Take 1 tablet (20 mg total) by mouth 2 (two) times daily. Patient not taking: Reported on 01/07/2017 09/08/16   Lorre Nick, MD  famotidine (PEPCID) 20 MG tablet Take 1 tablet (20 mg total) by mouth 2 (two) times daily. Patient not taking: Reported on 07/10/2016 04/27/16   Antony Madura, PA-C  ibuprofen (ADVIL,MOTRIN) 800 MG  tablet Take 1 tablet (800 mg total) by mouth every 6 (six) hours as needed. Patient not taking: Reported on 03/10/2016 12/04/15   Charlestine Night, PA-C  metroNIDAZOLE (FLAGYL) 500 MG tablet Take 1 tablet (500 mg total) by mouth 2 (two) times daily. Patient not taking: Reported on 01/07/2017 07/11/16   Derwood Kaplan, MD  naproxen (NAPROSYN) 500 MG tablet Take 1 tablet (500 mg total) by mouth 2 (two) times daily. Patient not taking: Reported on 07/10/2016 04/27/16   Antony Madura, PA-C  ondansetron (ZOFRAN ODT) 4 MG disintegrating tablet Take 1 tablet (4 mg total) by mouth every 8 (eight) hours as needed for nausea or vomiting. 01/07/17 01/10/17  Jalon Blackwelder, Amadeo Garnet, MD  penicillin v potassium (VEETID) 500 MG tablet Take 2 tablets (1,000 mg total) by mouth 2 (two) times daily. X 7 days Patient not taking: Reported on 03/10/2016 11/29/15   Pricilla Loveless, MD  ranitidine (ZANTAC) 150 MG capsule Take 1 capsule (150 mg total) by mouth daily. 01/07/17   Mazell Aylesworth, Amadeo Garnet, MD  traMADol (ULTRAM) 50 MG tablet Take 1 tablet (50 mg total) by mouth every 6 (six) hours as needed for severe pain. Patient not taking: Reported on 03/10/2016 12/04/15   Charlestine Night, PA-C    Family History Family History  Problem Relation Age of Onset  . Hypertension Father   . Diabetes Father     Social History Social History  Substance Use Topics  . Smoking status: Current Every Day Smoker  Packs/day: 0.25    Types: Cigarettes  . Smokeless tobacco: Never Used  . Alcohol use Yes     Comment: occ     Allergies   Other   Review of Systems Review of Systems  Constitutional: Negative for fever.  Gastrointestinal: Positive for abdominal pain, diarrhea, nausea and vomiting. Negative for anorexia, hematochezia and melena.   All other systems are reviewed and are negative for acute change except as noted in the HPI   Physical Exam Updated Vital Signs BP (!) 126/92 (BP Location: Left Arm)   Pulse 81    Temp 99.5 F (37.5 C) (Oral)   Resp 18   LMP 12/24/2016   SpO2 100%   Physical Exam  Constitutional: She is oriented to person, place, and time. She appears well-developed and well-nourished. No distress.  HENT:  Head: Normocephalic and atraumatic.  Nose: Nose normal.  Eyes: Conjunctivae and EOM are normal. Pupils are equal, round, and reactive to light. Right eye exhibits no discharge. Left eye exhibits no discharge. No scleral icterus.  Neck: Normal range of motion. Neck supple.  Cardiovascular: Normal rate and regular rhythm.  Exam reveals no gallop and no friction rub.   No murmur heard. Pulmonary/Chest: Effort normal and breath sounds normal. No stridor. No respiratory distress. She has no rales.  Abdominal: Soft. She exhibits no distension. There is tenderness in the left upper quadrant. There is no rigidity, no rebound, no guarding, no tenderness at McBurney's point and negative Murphy's sign.  Musculoskeletal: She exhibits no edema or tenderness.  Neurological: She is alert and oriented to person, place, and time.  Skin: Skin is warm and dry. No rash noted. She is not diaphoretic. No erythema.  Psychiatric: She has a normal mood and affect.  Vitals reviewed.    ED Treatments / Results  Labs (all labs ordered are listed, but only abnormal results are displayed) Labs Reviewed  COMPREHENSIVE METABOLIC PANEL - Abnormal; Notable for the following:       Result Value   AST 14 (*)    All other components within normal limits  CBC - Abnormal; Notable for the following:    Hemoglobin 11.9 (*)    MCV 71.6 (*)    MCH 23.6 (*)    All other components within normal limits  URINALYSIS, ROUTINE W REFLEX MICROSCOPIC - Abnormal; Notable for the following:    Color, Urine STRAW (*)    All other components within normal limits  LIPASE, BLOOD  PREGNANCY, URINE    EKG  EKG Interpretation None       Radiology No results found.  Procedures Procedures (including critical care  time)  Medications Ordered in ED Medications  gi cocktail (Maalox,Lidocaine,Donnatal) (30 mLs Oral Given 01/07/17 1602)     Initial Impression / Assessment and Plan / ED Course  I have reviewed the triage vital signs and the nursing notes.  Pertinent labs & imaging results that were available during my care of the patient were reviewed by me and considered in my medical decision making (see chart for details).  Clinical Course as of Jan 07 1645  Wed Jan 07, 2017  1640 Abdomen with left upper quadrant discomfort. Not concerning peritonitis. Screening labs obtained and are grossly reassuring without evidence of pancreatitis or biliary obstruction.   The patient's symptoms is most consistent with gastritis vs gastroenteritis (given the diarrhea). Low suspicion for serious intra-abd inflammatory/infectious symptoms.  Patient did report that she had a a gastric biopsy performed several years ago and  was diagnosed with H. pylori which was treated. Patient denies being on any daily antacids.  She was provided with a GI cocktail which has significant improvement in her symptomatology. She was able to tolerate by mouth intake.  The patient is safe for discharge with strict return precautions.   [PC]    Clinical Course User Index [PC] Naria Abbey, Amadeo GarnetPedro Eduardo, MD     Final Clinical Impressions(s) / ED Diagnoses   Final diagnoses:  Left upper quadrant pain  Nausea vomiting and diarrhea   Disposition: Discharge  Condition: Good  I have discussed the results, Dx and Tx plan with the patient who expressed understanding and agree(s) with the plan. Discharge instructions discussed at great length. The patient was given strict return precautions who verbalized understanding of the instructions. No further questions at time of discharge.    New Prescriptions   ONDANSETRON (ZOFRAN ODT) 4 MG DISINTEGRATING TABLET    Take 1 tablet (4 mg total) by mouth every 8 (eight) hours as needed for nausea  or vomiting.   RANITIDINE (ZANTAC) 150 MG CAPSULE    Take 1 capsule (150 mg total) by mouth daily.    Follow Up: Henrietta HooverBernhardt, Linda C, NP  Schedule an appointment as soon as possible for a visit  in 5-7 days, If symptoms do not improve or  worsen      Aalaya Yadao, Amadeo GarnetPedro Eduardo, MD 01/07/17 1646

## 2018-02-19 ENCOUNTER — Encounter (HOSPITAL_COMMUNITY): Payer: Self-pay

## 2018-02-19 ENCOUNTER — Emergency Department (HOSPITAL_COMMUNITY): Payer: Self-pay

## 2018-02-19 ENCOUNTER — Other Ambulatory Visit: Payer: Self-pay

## 2018-02-19 ENCOUNTER — Emergency Department (HOSPITAL_COMMUNITY)
Admission: EM | Admit: 2018-02-19 | Discharge: 2018-02-19 | Discharge status: Against Medical Advice | Payer: Self-pay | Attending: Emergency Medicine | Admitting: Emergency Medicine

## 2018-02-19 DIAGNOSIS — R079 Chest pain, unspecified: Secondary | ICD-10-CM | POA: Insufficient documentation

## 2018-02-19 DIAGNOSIS — Z5321 Procedure and treatment not carried out due to patient leaving prior to being seen by health care provider: Secondary | ICD-10-CM | POA: Insufficient documentation

## 2018-02-19 LAB — I-STAT TROPONIN, ED: TROPONIN I, POC: 0 ng/mL (ref 0.00–0.08)

## 2018-02-19 LAB — CBC
HCT: 37.5 % (ref 36.0–46.0)
Hemoglobin: 12.4 g/dL (ref 12.0–15.0)
MCH: 24.2 pg — AB (ref 26.0–34.0)
MCHC: 33.1 g/dL (ref 30.0–36.0)
MCV: 73.1 fL — ABNORMAL LOW (ref 78.0–100.0)
Platelets: 264 10*3/uL (ref 150–400)
RBC: 5.13 MIL/uL — ABNORMAL HIGH (ref 3.87–5.11)
RDW: 15.3 % (ref 11.5–15.5)
WBC: 8.7 10*3/uL (ref 4.0–10.5)

## 2018-02-19 LAB — BASIC METABOLIC PANEL
ANION GAP: 5 (ref 5–15)
BUN: 13 mg/dL (ref 6–20)
CALCIUM: 9.4 mg/dL (ref 8.9–10.3)
CO2: 27 mmol/L (ref 22–32)
Chloride: 109 mmol/L (ref 98–111)
Creatinine, Ser: 0.91 mg/dL (ref 0.44–1.00)
GFR calc Af Amer: >60 mL/min (ref >60)
GFR calc non Af Amer: >60 mL/min (ref >60)
GLUCOSE: 83 mg/dL (ref 70–99)
Potassium: 3.8 mmol/L (ref 3.5–5.1)
Sodium: 141 mmol/L (ref 135–145)

## 2018-02-19 LAB — I-STAT BETA HCG BLOOD, ED (MC, WL, AP ONLY): I-stat hCG, quantitative: <5 m[IU]/mL (ref <5)

## 2018-02-19 NOTE — ED Triage Notes (Signed)
PT C/O LEFT-SIDED CHEST PAIN WITH SOB SINCE LAST NIGHT. PT STS SHE WS LYING DOWN AT THE TIME IT STARTED, AND IT HAS BEEN ON AND OFF SINCE THEN. PT DENIES HX OF CARDIAC PROBLEMS. DENIES N/V.

## 2018-05-25 ENCOUNTER — Encounter: Payer: Self-pay | Admitting: Family Medicine

## 2018-05-25 ENCOUNTER — Ambulatory Visit: Payer: Self-pay | Attending: Family Medicine | Admitting: Family Medicine

## 2018-05-25 VITALS — BP 121/82 | HR 84 | Temp 99.7°F | Resp 18 | Ht 62.0 in | Wt 195.0 lb

## 2018-05-25 DIAGNOSIS — Z1239 Encounter for other screening for malignant neoplasm of breast: Secondary | ICD-10-CM

## 2018-05-25 DIAGNOSIS — F4310 Post-traumatic stress disorder, unspecified: Secondary | ICD-10-CM | POA: Insufficient documentation

## 2018-05-25 DIAGNOSIS — K529 Noninfective gastroenteritis and colitis, unspecified: Secondary | ICD-10-CM

## 2018-05-25 DIAGNOSIS — R1013 Epigastric pain: Secondary | ICD-10-CM

## 2018-05-25 DIAGNOSIS — F32A Depression, unspecified: Secondary | ICD-10-CM

## 2018-05-25 DIAGNOSIS — F419 Anxiety disorder, unspecified: Secondary | ICD-10-CM

## 2018-05-25 DIAGNOSIS — F329 Major depressive disorder, single episode, unspecified: Secondary | ICD-10-CM

## 2018-05-25 DIAGNOSIS — R1084 Generalized abdominal pain: Secondary | ICD-10-CM

## 2018-05-25 DIAGNOSIS — F17210 Nicotine dependence, cigarettes, uncomplicated: Secondary | ICD-10-CM | POA: Insufficient documentation

## 2018-05-25 DIAGNOSIS — R251 Tremor, unspecified: Secondary | ICD-10-CM

## 2018-05-25 DIAGNOSIS — F251 Schizoaffective disorder, depressive type: Secondary | ICD-10-CM | POA: Insufficient documentation

## 2018-05-25 DIAGNOSIS — Z79899 Other long term (current) drug therapy: Secondary | ICD-10-CM

## 2018-05-25 MED ORDER — DICYCLOMINE HCL 20 MG PO TABS: 20.0000 mg | ORAL_TABLET | Freq: Two times a day (BID) | ORAL | 1 refills | Status: DC

## 2018-05-25 MED ORDER — PANTOPRAZOLE SODIUM 40 MG PO TBEC: 40.0000 mg | DELAYED_RELEASE_TABLET | Freq: Every day | ORAL | 3 refills | Status: DC

## 2018-05-25 NOTE — Progress Notes (Signed)
Subjective:    Patient ID: Amy Salinas, female    DOB: June 27, 1986, 32 y.o.   MRN: [id number]  HPI 32 year old female new to the practice who presents secondary to complaint of recurrent stomach issues.  Patient reports that she has daily constipation and she believes that she might have IBS.  Patient also feels as if she has issues with acid reflux.  Patient denies any substernal chest pain but has some occasional burning sensation in her upper mid abdomen, patient sometimes has a backwash of bad tasting fluid into her mouth as well as burping and belching.   Patient has also noticed onset of a tremor.  Patient states that sometimes she has difficulty holding objects due to the shaking in her hands.  Patient is not quite sure how long she has noticed the presence of a tremor but believes it is been within the past year.  Patient reports her past medical history is significant for diagnoses of bipolar 1 disorder, PTSD and schizoaffective disorder for which she is followed at [facility].  Patient states that she is often depressed but states that that is just the way she is.  Patient denies any suicidal thoughts or ideations at the current time but states that she did have some suicidal thoughts in the past.  Patient has never formulated an actual plan.  Patient reports that she has recurrent generalized abdominal pain for which she has taken Tylenol 3 as well as Bentyl in the past.  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  Patient reports diarrhea on a daily basis.  Patient has tried over-the-counter medications such as Imodium as well as Metamucil and occasionally will have less frequent episodes of diarrhea but she feels that her diarrhea never really resolves and this is been occurring for several years.  Patient has seen no blood in the stool/diarrhea and no dark/sticky stools.      Patient reports that she moved from [place] to this area about 3 years  ago.  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  Patient does smoke approximately 3 cigarettes/day but states that about a year ago she was smoking 1 pack/day of cigarettes.  Patient reports family history of mother father with hypertension and diabetes.  Patient states that her dad has had prostate cancer and her mother had onset of breast cancer at age 2.  Patient has also had a maternal grandmother with breast cancer a paternal grandfather with pancreatic cancer and a maternal grandfather with prostate cancer.  Patient would like to see if she could be referred for a mammogram.    Review of Systems  Constitutional: Positive for fatigue. Negative for chills and fever.  HENT: Negative for sore throat, trouble swallowing and voice change.   Respiratory: Negative for cough and shortness of breath.   Cardiovascular: Negative for chest pain, palpitations and leg swelling.  Gastrointestinal: Positive for abdominal distention, abdominal pain and diarrhea. Negative for anal bleeding, blood in stool, constipation, nausea and vomiting.  Genitourinary: Negative for dysuria, frequency and hematuria.  Musculoskeletal: Negative for back pain, gait problem and joint swelling.  Neurological: Positive for tremors.  Psychiatric/Behavioral: Negative for self-injury, sleep disturbance and suicidal ideas. The patient is nervous/anxious.        Objective:   Physical Exam BP 121/82 (BP Location: Left Arm, Patient Position: Sitting, Cuff Size: Normal)   Pulse 84   Temp 99.7 F (37.6 C) (Oral)   Resp  18   Ht 5\' 2"  (1.575 m)   Wt 195 lb (88.5 kg)   SpO2 100%   BMI 35.67 kg/m Vital signs and nurse's notes reviewed GEN- Well-nourished, well-developed overweight young adult female in no acute distress, patient appears slightly younger than stated age ENT-TMs gray, nares with mild edema of the nasal mucosa, patient with mild posterior pharynx erythema Neck-supple, no lymphadenopathy, mild  fullness of the thyroid area which may be secondary to body habitus Cardiovascular-regular rate and rhythm Lungs-clear to auscultation bilaterally Back-no CVA tenderness Abdomen- patient with mild abdominal distention, patient does not have any discrete epigastric tenderness on examination but rather complains of generalized abdominal pain in all quadrants but no rebound or guarding and no facial or focal expressions of pain or discomfort Extremities-no edema Neuro- cranial nerves II through XII are grossly intact.  Patient did not appear to exhibit a tremor unless she was actually talking about having a tremor and patient with some shaking of her hands when talking about her tremor.  Tremor was about the same whether patient had her hands proximal to her body or extended  Psych-patient appeared to be in a normal mood and seemed to exhibit normal judgment for the most part.  Patient was initially slightly quiet and withdrawn but then engaged appropriately     Assessment & Plan:  1. Chronic diarrhea Patient with complaint of chronic issues with diarrhea which she states tends to occur daily.  Patient will have TSH and T4 as well as CMP in follow-up of her issues with chronic diarrhea and patient will be referred to gastroenterology.  Patient does not seem to feel that there are any certain foods that make her GI issues any better or worse.  Patient is encouraged to try over-the-counter probiotics or yogurt with active cultures to see if this gives any improvement to her symptoms. - TSH + free T4 - Comprehensive metabolic panel - Ambulatory referral to Gastroenterology  2. Generalized abdominal pain Patient with complaint of generalized abdominal pain as well as chronic diarrhea.  Patient will have CBC to look for any blood loss or elevated white blood cell count suggestive of infection as well as CMP.  Patient will be referred to GI for further evaluation and treatment.  Patient will be placed on  pantoprazole for dyspepsia/reflux and patient given refill of Bentyl to take as needed for abdominal pain.  Patient requested Tylenol 3 for her abdominal pain and I discussed with the patient that we needed to find out the cause of her abdominal pain and not give medications to mask the pain.  Discussed with patient that narcotic medication would not be appropriate for her current symptoms. - CBC with Differential - pantoprazole (PROTONIX) 40 MG tablet; Take 1 tablet (40 mg total) by mouth daily.  Dispense: 30 tablet; Refill: 3 - dicyclomine (BENTYL) 20 MG tablet; Take 1 tablet (20 mg total) by mouth 2 (two) times daily. As needed for abdominal pain  Dispense: 60 tablet; Refill: 1  3. Dyspepsia Patient with complaint of acid reflux type symptoms and patient will be started on pantoprazole 40 mg daily.  Patient is also advised to avoid eating within 2 hours of bedtime/avoidance of late night eating as well as avoidance of spicy/greasy foods or foods which she knows trigger her symptoms.  Patient was also encouraged to stop the use of nonsteroidal anti-inflammatory such as ibuprofen and naproxen. - pantoprazole (PROTONIX) 40 MG tablet; Take 1 tablet (40 mg total) by mouth daily.  Dispense: 30 tablet; Refill: 3 - dicyclomine (BENTYL) 20 MG tablet; Take 1 tablet (20 mg total) by mouth 2 (two) times daily. As needed for abdominal pain  Dispense: 60 tablet; Refill: 1  4. Tremor Patient with complaint of a tremor.  I am not sure if this is an actual tremor or psychogenic.  Patient is on some psychiatric medications but I am not sure if any of these would cause or contribute to her tremor.  Patient is currently on Depakote, Latuda and Remeron.  Patient will have TSH and free T4 to look for hyperthyroidism as a possible cause of her tremor.  Patient will also have CMP to look for electrolyte or liver disorder.  Patient denies any family history of tremor such as essential/familial tremor or Parkinson's. - TSH +  free T4  5. Encounter for long-term current use of medication Patient will have CBC and CMP also in follow-up of her use of multiple psychiatric medications. - CBC with Differential - Comprehensive metabolic panel  6. Screening for breast cancer Patient with report of her mother developing breast cancer at around age 11.  Patient also reports that her maternal grandmother had breast cancer as well.  I will place an order for a screening mammogram however I am unsure if this will be covered.  Patient reports no abnormalities on self breast exam but states that in the past she has had some breast discomfort before her periods and she was told that this was hormonal.  Patient states that she was referred for mammogram in the past but coverage was denied due to her age and lack of symptoms.  Patient however does - MM Digital Screening; Future  7.  Anxiety and depression Patient with abnormal PHQ 9 screening for depression.  Patient however stated that she is receiving treatment and counseling.  Will have social work follow-up with patient however as well.  An After Visit Summary was printed and given to the patient.  Return in about 5 weeks (around 06/29/2018) for abdominal pain.

## 2018-05-26 ENCOUNTER — Encounter: Payer: Self-pay | Admitting: Gastroenterology

## 2018-05-26 LAB — CBC WITH DIFFERENTIAL/PLATELET
Basophils Absolute: 0 x10E3/uL (ref 0.0–0.2)
Basos: 0 %
EOS (ABSOLUTE): 0.2 x10E3/uL (ref 0.0–0.4)
Eos: 2 %
Hematocrit: 37.2 % (ref 34.0–46.6)
Hemoglobin: 11.2 g/dL (ref 11.1–15.9)
Immature Grans (Abs): 0 x10E3/uL (ref 0.0–0.1)
Immature Granulocytes: 0 %
Lymphocytes Absolute: 4.4 x10E3/uL — ABNORMAL HIGH (ref 0.7–3.1)
Lymphs: 46 %
MCH: 22.9 pg — ABNORMAL LOW (ref 26.6–33.0)
MCHC: 30.1 g/dL — ABNORMAL LOW (ref 31.5–35.7)
MCV: 76 fL — ABNORMAL LOW (ref 79–97)
Monocytes Absolute: 0.4 x10E3/uL (ref 0.1–0.9)
Monocytes: 5 %
Neutrophils Absolute: 4.4 x10E3/uL (ref 1.4–7.0)
Neutrophils: 47 %
Platelets: 312 x10E3/uL (ref 150–450)
RBC: 4.9 x10E6/uL (ref 3.77–5.28)
RDW: 16.5 % — ABNORMAL HIGH (ref 12.3–15.4)
WBC: 9.4 x10E3/uL (ref 3.4–10.8)

## 2018-05-26 LAB — COMPREHENSIVE METABOLIC PANEL WITH GFR
ALT: 11 IU/L (ref 0–32)
AST: 16 IU/L (ref 0–40)
Albumin/Globulin Ratio: 1.7 (ref 1.2–2.2)
Albumin: 4.5 g/dL (ref 3.5–5.5)
Alkaline Phosphatase: 60 IU/L (ref 39–117)
BUN/Creatinine Ratio: 13 (ref 9–23)
BUN: 9 mg/dL (ref 6–20)
Bilirubin Total: <0.2 mg/dL (ref 0.0–1.2)
CO2: 21 mmol/L (ref 20–29)
Calcium: 9.2 mg/dL (ref 8.7–10.2)
Chloride: 104 mmol/L (ref 96–106)
Creatinine, Ser: 0.72 mg/dL (ref 0.57–1.00)
GFR calc Af Amer: 129 mL/min/1.73
GFR calc non Af Amer: 112 mL/min/1.73
Globulin, Total: 2.6 g/dL (ref 1.5–4.5)
Glucose: 81 mg/dL (ref 65–99)
Potassium: 4.2 mmol/L (ref 3.5–5.2)
Sodium: 142 mmol/L (ref 134–144)
Total Protein: 7.1 g/dL (ref 6.0–8.5)

## 2018-05-26 LAB — TSH+FREE T4
Free T4: 1.56 ng/dL (ref 0.82–1.77)
TSH: 0.551 u[IU]/mL (ref 0.450–4.500)

## 2018-05-31 ENCOUNTER — Telehealth: Payer: Self-pay

## 2018-05-31 NOTE — Telephone Encounter (Signed)
Spoke to Ms Lanza and gave her the blood work results and that Dr Jillyn Hidden would like her to take MVI with Iron for cell size.  She repeated back what, how much, and how often to take the vitamin with iron.   Instructed pt that the iron can be constipating and to be aware that that is another factor in managing her bowels, needs an IBS med and especially since she has other constipating medications. She said that the codeine and tramadol do not constipate her she she will carefully note changes in bowel schedule.  Encouraged pt to call back with any questions or concerns. At this point she mentioned that she had a flu shot 9 days ago and since then she has had some sore throat, aching, sneezing.  Pt said her temperature has stayed around  97.4.  I offered appointment to see a provider today and she declined.   She did agree to monitor her temperature.  She will give [facility] at call if temp goes over 100, since her baseline runs low,  Or if there are any other significant changes.  She voiced agreement with the plan and said for changes or not getting better she will get appt to see  Her provider.

## 2018-06-02 ENCOUNTER — Telehealth: Payer: Self-pay | Admitting: *Deleted

## 2018-06-02 NOTE — Telephone Encounter (Signed)
Patient verified DOB Patient is aware of labs being normal and the recommendations of taking an OTC multivitamin with iron in it. Patient had no further questions.

## 2018-06-02 NOTE — Telephone Encounter (Signed)
-----   Message from [name], MD sent at 05/27/2018 10:49 PM EDT ----- Notify patient that her CBC is normal with the exception of a mild decrease in cell size- multivitamin with iron recommended. CMP and thyroid blood tests were normal

## 2018-06-10 ENCOUNTER — Telehealth: Payer: Self-pay | Admitting: Family Medicine

## 2018-06-10 NOTE — Telephone Encounter (Signed)
Pt called in regards to her mammogram, she states the Breast center requires a new order for a different type of mammogram. Please follow up with the Breast center for she does not know exactly what they need.

## 2018-06-11 NOTE — Telephone Encounter (Signed)
Cma spoke with breast center, the breast center stated the patient needs a diagnostic US order put in since the patient complained of tenderness in her breast. Please fu at your earliest convenience.

## 2018-06-11 NOTE — Telephone Encounter (Signed)
Which breast for the diagnostic US order?

## 2018-06-14 ENCOUNTER — Other Ambulatory Visit: Payer: Self-pay | Admitting: Family Medicine

## 2018-06-14 DIAGNOSIS — N644 Mastodynia: Secondary | ICD-10-CM

## 2018-06-14 NOTE — Telephone Encounter (Signed)
Ok, I will placed order for [place] of both breasts secondary to breast pain

## 2018-06-14 NOTE — Progress Notes (Signed)
Patient ID: Amy Salinas, female   DOB: 1985-12-10, 32 y.o.   MRN: [id number]   See phone encounter.  Patient would like ultrasound of both breasts due to breast tenderness/pain in both breasts.

## 2018-06-14 NOTE — Telephone Encounter (Signed)
Patient was called, verified dob, and was asked which breast for the bilateral diagnostic US, patient stated that she is having tenderness in both breast.

## 2018-06-28 ENCOUNTER — Ambulatory Visit: Payer: Medicaid Other | Admitting: Gastroenterology

## 2018-07-30 ENCOUNTER — Encounter: Payer: Self-pay | Admitting: Gastroenterology

## 2018-07-30 ENCOUNTER — Ambulatory Visit (INDEPENDENT_AMBULATORY_CARE_PROVIDER_SITE_OTHER): Payer: Self-pay | Admitting: Gastroenterology

## 2018-07-30 ENCOUNTER — Other Ambulatory Visit (INDEPENDENT_AMBULATORY_CARE_PROVIDER_SITE_OTHER): Payer: Medicaid Other

## 2018-07-30 VITALS — BP 118/78 | Ht 62.0 in | Wt 191.4 lb

## 2018-07-30 DIAGNOSIS — R197 Diarrhea, unspecified: Secondary | ICD-10-CM

## 2018-07-30 DIAGNOSIS — R110 Nausea: Secondary | ICD-10-CM

## 2018-07-30 LAB — C-REACTIVE PROTEIN: CRP: 0.3 mg/dL — AB (ref 0.5–20.0)

## 2018-07-30 LAB — SEDIMENTATION RATE: SED RATE: 27 mm/h — AB (ref 0–20)

## 2018-07-30 LAB — IGA: IGA: 196 mg/dL (ref 68–378)

## 2018-07-30 NOTE — Patient Instructions (Signed)
Your provider has requested that you go to the basement level for lab work before leaving today. Press "B" on the elevator. The lab is located at the first door on the left as you exit the elevator.  You have been scheduled for an abdominal ultrasound at [facility] (1st floor of hospital) on 08-04-18 at 3:30 pm. Please arrive 15 minutes prior to your appointment for registration. Make certain not to have anything to eat or drink 6 hours prior to your appointment. Should you need to reschedule your appointment, please contact radiology at [phone]. This test typically takes about 30 minutes to perform.  Try a daily probiotic such as Align.

## 2018-07-30 NOTE — Progress Notes (Signed)
Referring Provider: Antony Blackbird, MD Primary Care Physician:  Antony Blackbird, MD   Reason for Consultation:  diarrhea   IMPRESSION:  Diarrhea Nausea H. Pylori in 2009    - treated with triple therapy    - follow-up testing was negative Patient is worried about Crohns  The differential diagnosis of chronic diarrhea and nausea without alarm features includes: irritable bowel syndrome, IBD, celiac disease, missed infection (such as giardia), food intolerance,microscopic colitis, thyroid disorder, other functional GI disease.   PLAN: - Fecal calprotectin, ESR, and CRP to screen for IBD - Giardia testing - IgA tissue transglutaminase, IgA level to test for celiac disease - Abdominal ultrasound to evaluate her pancreas, gallbladder, and liver - Obtain GI records from [place] including prior endoscopy results - Trial of Align (coupon provided) - Return to clinic in 3-4 weeks - Consider trial of Zelnorm  ADDENDUM: Records from [place], [place] 08/16/13 showed a normal CMP (AST 13, ALT 11, alk phos 58, TB 0.3), lipase 21, amylase 75, WBC 6.88, hgb 11.5, platelets 257. CT abd/pelvis with IV and oral contrast 08/16/13: normal liver, small liver cyst, no gallstones, normal pancreas. The terminal ileum is thickening just proximal to the cecum, however the area is poorly distended.   The TI findings are likely due to a poorly distended bowel, and less likely to be Crohn's disease. No new recommendations beyond those above.    HPI: Amy Salinas is a 32 y.o. working in hotel housekeeping seen in consultation at the request of Dr. Chapman Fitch for diarrhea. She has bipolar 1 disorder, PTSD, schizoaffective disorder. Her father accompanies her to this appointment. Moved to [place] from [place] in 2015.   She reports many years of abdominal problems with associated diarrhea, nausea, and abdominal cramps.  Diarrhea: all day every day. 10-12 BM. No blood or mucous. Unformed stools. Explosive, watery. Frequent  nocturnal symptoms. Occasionally soils her clothes, even wears diapers.   Nausea: Concurrent with abdominal cramping. Associated with anxiety. Occassional vomiting, bilious, mucous. No heartburn, brash, regurgitation, or hematemesis.  Abdominal cramps: Sharp, stabbing, nonradiating, band in the midabdomen. Occasionally radiates to the back.  Not changed by bowel movements.   Trial of Prilosec, omeprazole, daily metamucil for 3-4 months, Nexium, Bentyl x years without change. No significant change with Tylenol #3, Naproxen, and Tramadol in the past. She uses a daily probiotic.  Labs 05/25/18: TSH 0.551, WBC 9.4, hgb 11.2, platelets 312. Normal CMP.   Colonoscopy and upper endoscopy for these same symptoms performed in [place].   She is concerned that she may have Crohns based on her prior evaluation.  Father is concerned about cancer. Her paternal grandmother and paternal uncle had colon cancer. Father with colon polyps.   No abdominal imaging since her move to [place].   Past Medical History:  Diagnosis Date  . Bipolar 1 disorder ([place])   . GERD (gastroesophageal reflux disease)   . PTSD (post-traumatic stress disorder)   . Schizoaffective disorder, bipolar type [facility])     Past Surgical History:  Procedure Laterality Date  . biopsy of stomach    . INDUCED ABORTION  2009    Current Outpatient Medications  Medication Sig Dispense Refill  . acetaminophen-codeine (TYLENOL #3) 300-30 MG tablet Take 1 tablet by mouth every 6 (six) hours as needed for moderate pain. 10 tablet 0  . cyclobenzaprine (FLEXERIL) 10 MG tablet Take 10 mg by mouth 3 (three) times daily as needed for muscle spasms.    Marland Kitchen dicyclomine (BENTYL) 20 MG  tablet Take 1 tablet (20 mg total) by mouth 2 (two) times daily. As needed for abdominal pain 60 tablet 1  . divalproex (DEPAKOTE) 500 MG DR tablet Take 500 mg by mouth 3 (three) times daily.    . Lurasidone HCl (LATUDA) 120 MG TABS Take by mouth daily.    . mirtazapine (REMERON)  30 MG tablet Take 30 mg by mouth at bedtime.    . pantoprazole (PROTONIX) 40 MG tablet Take 1 tablet (40 mg total) by mouth daily. 30 tablet 3   No current facility-administered medications for this visit.     Allergies as of 07/30/2018 - Review Complete 07/30/2018  Allergen Reaction Noted  . Other Anaphylaxis 01/07/2015    Family History  Problem Relation Age of Onset  . Breast cancer Mother   . Hypertension Father   . Diabetes Father   . Prostate cancer Father   . Colon cancer Neg Hx     Social History   Socioeconomic History  . Marital status: Single    Spouse name: Not on file  . Number of children: Not on file  . Years of education: Not on file  . Highest education level: Not on file  Occupational History  . Not on file  Social Needs  . Financial resource strain: Not on file  . Food insecurity:    Worry: Not on file    Inability: Not on file  . Transportation needs:    Medical: Not on file    Non-medical: Not on file  Tobacco Use  . Smoking status: Current Every Day Smoker    Packs/day: 0.25    Types: Cigarettes  . Smokeless tobacco: Never Used  Substance and Sexual Activity  . Alcohol use: Yes    Comment: occ  . Drug use: No  . Sexual activity: Yes  Lifestyle  . Physical activity:    Days per week: Not on file    Minutes per session: Not on file  . Stress: Not on file  Relationships  . Social connections:    Talks on phone: Not on file    Gets together: Not on file    Attends religious service: Not on file    Active member of club or organization: Not on file    Attends meetings of clubs or organizations: Not on file    Relationship status: Not on file  . Intimate partner violence:    Fear of current or ex partner: Not on file    Emotionally abused: Not on file    Physically abused: Not on file    Forced sexual activity: Not on file  Other Topics Concern  . Not on file  Social History Narrative  . Not on file    Review of Systems: 12  system ROS is negative except as noted above.  Filed Weights   07/30/18 1501  Weight: 191 lb 6 oz (86.8 kg)    Physical Exam: Vital signs were reviewed. General:   Alert, well-nourished, pleasant and cooperative in NAD Head:  Normocephalic and atraumatic. Eyes:  Sclera clear, no icterus.   Conjunctiva pink. Mouth:  No deformity or lesions.   Neck:  Supple; no thyromegaly. Lungs:  Clear throughout to auscultation.   No wheezes.  Heart:  Regular rate and rhythm; no murmurs Abdomen:  Soft, pain localized in the upper abdomen but I am unable to reproduce her symptoms, normal bowel sounds. No rebound or guarding. No hepatosplenomegaly Rectal:  Deferred  Msk:  Symmetrical without gross deformities.  Extremities:  No gross deformities or edema. Neurologic:  Alert and  oriented x4;  grossly nonfocal Skin:  No rash or bruise. Psych:  Alert and cooperative. Normal mood and affect. Speech is slightly pressured.    Tyrea Froberg L. Tarri Glenn, MD, MPH [facility] 08/09/2018, 8:39 PM

## 2018-08-02 LAB — TISSUE TRANSGLUTAMINASE, IGA: (TTG) AB, IGA: 1 U/mL

## 2018-08-04 ENCOUNTER — Ambulatory Visit (HOSPITAL_COMMUNITY)
Admission: RE | Admit: 2018-08-04 | Discharge: 2018-08-04 | Disposition: A | Discharge status: Home / Self Care | Payer: Self-pay | Source: Ambulatory Visit | Attending: Gastroenterology | Admitting: Gastroenterology

## 2018-08-04 ENCOUNTER — Other Ambulatory Visit: Payer: Medicaid Other

## 2018-08-04 DIAGNOSIS — R110 Nausea: Secondary | ICD-10-CM | POA: Insufficient documentation

## 2018-08-04 DIAGNOSIS — R197 Diarrhea, unspecified: Secondary | ICD-10-CM

## 2018-08-06 LAB — GIARDIA ANTIGEN
MICRO NUMBER:: 91483886
RESULT: NOT DETECTED
SPECIMEN QUALITY:: ADEQUATE

## 2018-08-06 LAB — CALPROTECTIN, FECAL: Calprotectin, Fecal: 42 ug/g (ref 0–120)

## 2018-08-09 ENCOUNTER — Encounter: Payer: Self-pay | Admitting: Gastroenterology

## 2018-08-23 ENCOUNTER — Emergency Department (HOSPITAL_COMMUNITY)
Admission: EM | Admit: 2018-08-23 | Discharge: 2018-08-23 | Disposition: A | Discharge status: Home / Self Care | Payer: Medicaid Other | Attending: Emergency Medicine | Admitting: Emergency Medicine

## 2018-08-23 ENCOUNTER — Encounter (HOSPITAL_COMMUNITY): Payer: Self-pay | Admitting: *Deleted

## 2018-08-23 DIAGNOSIS — Z79899 Other long term (current) drug therapy: Secondary | ICD-10-CM | POA: Insufficient documentation

## 2018-08-23 DIAGNOSIS — K219 Gastro-esophageal reflux disease without esophagitis: Secondary | ICD-10-CM | POA: Insufficient documentation

## 2018-08-23 DIAGNOSIS — R350 Frequency of micturition: Secondary | ICD-10-CM | POA: Insufficient documentation

## 2018-08-23 DIAGNOSIS — F17210 Nicotine dependence, cigarettes, uncomplicated: Secondary | ICD-10-CM | POA: Insufficient documentation

## 2018-08-23 DIAGNOSIS — R1013 Epigastric pain: Secondary | ICD-10-CM

## 2018-08-23 LAB — URINALYSIS, ROUTINE W REFLEX MICROSCOPIC
BILIRUBIN URINE: NEGATIVE
Glucose, UA: NEGATIVE mg/dL
KETONES UR: NEGATIVE mg/dL
LEUKOCYTES UA: NEGATIVE
Nitrite: NEGATIVE
Protein, ur: NEGATIVE mg/dL
Specific Gravity, Urine: 1.013 (ref 1.005–1.030)
pH: 7 (ref 5.0–8.0)

## 2018-08-23 LAB — COMPREHENSIVE METABOLIC PANEL
ALBUMIN: 4.4 g/dL (ref 3.5–5.0)
ALK PHOS: 54 U/L (ref 38–126)
ALT: 14 U/L (ref 0–44)
AST: 17 U/L (ref 15–41)
Anion gap: 12 (ref 5–15)
BUN: 10 mg/dL (ref 6–20)
CALCIUM: 9.4 mg/dL (ref 8.9–10.3)
CHLORIDE: 104 mmol/L (ref 98–111)
CO2: 23 mmol/L (ref 22–32)
CREATININE: 1.01 mg/dL — AB (ref 0.44–1.00)
GFR calc non Af Amer: >60 mL/min (ref >60)
GLUCOSE: 156 mg/dL — AB (ref 70–99)
Potassium: 3.9 mmol/L (ref 3.5–5.1)
SODIUM: 139 mmol/L (ref 135–145)
Total Bilirubin: 0.3 mg/dL (ref 0.3–1.2)
Total Protein: 8 g/dL (ref 6.5–8.1)

## 2018-08-23 LAB — CBC
HEMATOCRIT: 42.3 % (ref 36.0–46.0)
Hemoglobin: 12.8 g/dL (ref 12.0–15.0)
MCH: 23.4 pg — AB (ref 26.0–34.0)
MCHC: 30.3 g/dL (ref 30.0–36.0)
MCV: 77.3 fL — AB (ref 80.0–100.0)
NRBC: 0 % (ref 0.0–0.2)
PLATELETS: 282 10*3/uL (ref 150–400)
RBC: 5.47 MIL/uL — AB (ref 3.87–5.11)
RDW: 16.2 % — AB (ref 11.5–15.5)
WBC: 7.2 10*3/uL (ref 4.0–10.5)

## 2018-08-23 LAB — I-STAT BETA HCG BLOOD, ED (MC, WL, AP ONLY)

## 2018-08-23 LAB — LIPASE, BLOOD: LIPASE: 34 U/L (ref 11–51)

## 2018-08-23 MED ORDER — FAMOTIDINE 20 MG PO TABS: 20.0000 mg | ORAL_TABLET | Freq: Every day | ORAL | 0 refills | Status: DC

## 2018-08-23 MED ORDER — FAMOTIDINE 20 MG PO TABS
20.0000 mg | ORAL_TABLET | Freq: Once | ORAL | Status: AC
  Administered 2018-08-23: 20 mg via ORAL
  Filled 2018-08-23: qty 1

## 2018-08-23 MED ORDER — ACETAMINOPHEN 325 MG PO TABS
650.0000 mg | ORAL_TABLET | Freq: Once | ORAL | Status: AC
  Administered 2018-08-23: 650 mg via ORAL
  Filled 2018-08-23: qty 2

## 2018-08-23 MED ORDER — ALUM & MAG HYDROXIDE-SIMETH 400-400-40 MG/5ML PO SUSP: 10.0000 mL | Freq: Four times a day (QID) | ORAL | 0 refills | Status: DC | PRN

## 2018-08-23 MED ORDER — LIDOCAINE VISCOUS HCL 2 % MT SOLN
15.0000 mL | Freq: Once | OROMUCOSAL | Status: AC
  Administered 2018-08-23: 15 mL via OROMUCOSAL
  Filled 2018-08-23: qty 15

## 2018-08-23 NOTE — ED Triage Notes (Signed)
Pt complains of abdominal pain, back pain, nausea, diarrhea, increased urinary frequency x 2 days.

## 2018-08-23 NOTE — ED Provider Notes (Signed)
[facility]-EMERGENCY DEPT Provider Note   CSN: [id number] Arrival date & time: 08/23/18  1005     History   Chief Complaint Chief Complaint  Patient presents with  . Abdominal Pain  . Back Pain  . Diarrhea    HPI Amy Salinas is a 32 y.o. female presenting for evaluation of abdominal burning, cough, and nausea.  Patient states she has had abdominal burning which is anterior and posterior, worse at night.  She has associated cough, worse at night.  She reports nausea without vomiting.  For the past 2 days, she has had increased urinary frequency.  She denies dysuria or hematuria.  Patient states she has not taken anything for her symptoms including Tylenol or ibuprofen.  She managed with lower GI, has not followed up with him regarding this pain.  Patient states she has a history of GERD/reflux, but is not currently on any medication.  She has been treated for H. pylori in the past.  She denies significant NSAID use.  Denies fevers, chills, chest pain, shortness of breath, abnormal bowel movements.  HPI  Past Medical History:  Diagnosis Date  . Bipolar 1 disorder ([facility])   . GERD (gastroesophageal reflux disease)   . PTSD (post-traumatic stress disorder)   . Schizoaffective disorder, bipolar type ([facility])     There are no active problems to display for this patient.   Past Surgical History:  Procedure Laterality Date  . biopsy of stomach    . INDUCED ABORTION  2009     OB History   No obstetric history on file.      Home Medications    Prior to Admission medications   Medication Sig Start Date End Date Taking? Authorizing Provider  cyclobenzaprine (FLEXERIL) 10 MG tablet Take 10 mg by mouth 3 (three) times daily as needed for muscle spasms.   Yes [provider]  divalproex (DEPAKOTE) 500 MG DR tablet Take 500 mg by mouth 3 (three) times daily.   Yes [provider]  Lurasidone HCl (LATUDA) 120 MG TABS Take 120 mg by mouth  daily.    Yes [provider]  mirtazapine (REMERON) 30 MG tablet Take 30 mg by mouth at bedtime.   Yes [provider]  acetaminophen-codeine (TYLENOL #3) 300-30 MG tablet Take 1 tablet by mouth every 6 (six) hours as needed for moderate pain. Patient not taking: Reported on 08/23/2018 09/08/16   Lorre NickAllen, Anthony, MD  alum & mag hydroxide-simeth (MAALOX MAX) 400-400-40 MG/5ML suspension Take 10 mLs by mouth every 6 (six) hours as needed for indigestion. 08/23/18   Sonyia Muro, PA-C  dicyclomine (BENTYL) 20 MG tablet Take 1 tablet (20 mg total) by mouth 2 (two) times daily. As needed for abdominal pain Patient not taking: Reported on 08/23/2018 05/25/18   [name], Hewitt Shortsammie, MD  famotidine (PEPCID) 20 MG tablet Take 1 tablet (20 mg total) by mouth daily. 08/23/18   Austen Wygant, PA-C  pantoprazole (PROTONIX) 40 MG tablet Take 1 tablet (40 mg total) by mouth daily. Patient not taking: Reported on 08/23/2018 05/25/18   Cain SaupeFulp, [name], MD    Family History Family History  Problem Relation Age of Onset  . Breast cancer Mother   . Hypertension Father   . Diabetes Father   . Prostate cancer Father   . Colon cancer Neg Hx     Social History Social History   Tobacco Use  . Smoking status: Current Every Day Smoker    Packs/day: 0.25  Types: Cigarettes  . Smokeless tobacco: Never Used  Substance Use Topics  . Alcohol use: Yes    Comment: occ  . Drug use: No     Allergies   Other   Review of Systems Review of Systems  Gastrointestinal: Positive for abdominal pain and nausea.  Genitourinary: Positive for frequency.  All other systems reviewed and are negative.    Physical Exam Updated Vital Signs BP 130/87   Pulse 95   Temp 98.9 F (37.2 C) (Oral)   Resp 16   Ht 5\' 1"  (1.549 m)   Wt 86.2 kg   SpO2 99%   BMI 35.90 kg/m   Physical Exam Vitals signs and nursing note reviewed.  Constitutional:      General: She is not in acute distress.     Appearance: She is well-developed.     Comments: Obese female sitting comfortably in the bed in no acute distress.  HENT:     Head: Normocephalic and atraumatic.  Eyes:     Conjunctiva/sclera: Conjunctivae normal.     Pupils: Pupils are equal, round, and reactive to light.  Neck:     Musculoskeletal: Normal range of motion and neck supple.  Cardiovascular:     Rate and Rhythm: Normal rate and regular rhythm.  Pulmonary:     Effort: Pulmonary effort is normal. No respiratory distress.     Breath sounds: Normal breath sounds. No wheezing.  Abdominal:     General: There is no distension.     Palpations: Abdomen is soft.     Tenderness: There is no abdominal tenderness.     Comments: No tenderness palpation of the abdomen.  Soft no rigidity, guarding, distention.  Negative rebound.  Musculoskeletal: Normal range of motion.  Skin:    General: Skin is warm and dry.  Neurological:     Mental Status: She is alert and oriented to person, place, and time.      ED Treatments / Results  Labs (all labs ordered are listed, but only abnormal results are displayed) Labs Reviewed  COMPREHENSIVE METABOLIC PANEL - Abnormal; Notable for the following components:      Result Value   Glucose, Bld 156 (*)    Creatinine, Ser 1.01 (*)    All other components within normal limits  CBC - Abnormal; Notable for the following components:   RBC 5.47 (*)    MCV 77.3 (*)    MCH 23.4 (*)    RDW 16.2 (*)    All other components within normal limits  URINALYSIS, ROUTINE W REFLEX MICROSCOPIC - Abnormal; Notable for the following components:   Hgb urine dipstick MODERATE (*)    Bacteria, UA RARE (*)    All other components within normal limits  LIPASE, BLOOD  I-STAT BETA HCG BLOOD, ED ([facility], [facility], [facility] ONLY)    EKG None  Radiology No results found.  Procedures Procedures (including critical care time)  Medications Ordered in ED Medications  lidocaine (XYLOCAINE) 2 % viscous mouth solution 15 mL (15  mLs Mouth/Throat Given 08/23/18 1520)  famotidine (PEPCID) tablet 20 mg (20 mg Oral Given 08/23/18 1519)  acetaminophen (TYLENOL) tablet 650 mg (650 mg Oral Given 08/23/18 1550)     Initial Impression / Assessment and Plan / ED Course  I have reviewed the triage vital signs and the nursing notes.  Pertinent labs & imaging results that were available during my care of the patient were reviewed by me and considered in my medical decision making (see chart for  details).     Presenting for evaluation of abdominal and back burning, worse at night.  She has associated cough and nausea.  History of reflux, but not currently on any medication.  Symptoms are likely due to reflux.  Labs reassuring, no leukocytosis.  Kidney, liver, pancreatic function reassuring.  Urine shows moderate blood, but no signs of UTI.  Doubt kidney stone, as she is pain-free and without CVA tenderness at this time.  Symptoms likely related to reflux.  Doubt intra-abdominal infection, perforation, obstruction, or surgical abdomen.  I do not believe CT scan is necessary at this time.  Patient given Pepcid and Maalox, with mild improvement.  Will discharge with Pepcid, Maalox as needed for breakthrough.  Patient encouraged to follow-up with GI.  At this time, patient appears safe for discharge.  Return precautions given.  Patient states she understands and agrees to plan.   Final Clinical Impressions(s) / ED Diagnoses   Final diagnoses:  Epigastric burning sensation  Gastroesophageal reflux disease, esophagitis presence not specified    ED Discharge Orders         Ordered    famotidine (PEPCID) 20 MG tablet  Daily     08/23/18 1540    alum & mag hydroxide-simeth (MAALOX MAX) 400-400-40 MG/5ML suspension  Every 6 hours PRN     08/23/18 1540           Laurice Iglesia, PA-C 08/23/18 2346    Azalia Bilisampos, Kevin, MD 08/23/18 2348

## 2018-08-23 NOTE — Discharge Instructions (Signed)
Continue taking home medications as prescribed. Take Pepcid daily. Use Maalox as needed for breakthrough pain. Use Tylenol as needed for pain. Avoid foods that can worsen reflux.  There is information about this in the paperwork.  Stay upright for 60 minutes after eating to decrease symptoms. Follow-up with your stomach doctor for further evaluation of your symptoms. Return to the emergency room with any new, worsening, concerning symptoms.

## 2018-08-23 NOTE — ED Notes (Signed)
ED Provider at bedside. 

## 2018-08-31 ENCOUNTER — Telehealth: Payer: Self-pay | Admitting: Family Medicine

## 2018-08-31 DIAGNOSIS — R1084 Generalized abdominal pain: Secondary | ICD-10-CM

## 2018-08-31 DIAGNOSIS — R1013 Epigastric pain: Secondary | ICD-10-CM

## 2018-08-31 MED ORDER — DICYCLOMINE HCL 20 MG PO TABS: 20.0000 mg | ORAL_TABLET | Freq: Two times a day (BID) | ORAL | 0 refills | Status: DC

## 2018-08-31 MED ORDER — PANTOPRAZOLE SODIUM 40 MG PO TBEC: 40.0000 mg | DELAYED_RELEASE_TABLET | Freq: Every day | ORAL | 2 refills | Status: DC

## 2018-08-31 NOTE — Telephone Encounter (Signed)
1) Medication(s) Requested (by name): -pantoprazole (PROTONIX) 40 MG tablet  -dicyclomine (BENTYL) 20 MG tablet   2) Pharmacy of Choice: -[id number] - [place], [place] - [address]) Special Requests:   Approved medications will be sent to the pharmacy, we will reach out if there is an issue.  Requests made after 3pm may not be addressed until the following business day!  If a patient is unsure of the name of the medication(s) please note and ask patient to call back when they are able to provide all info, do not send to responsible party until all information is available!

## 2018-09-20 ENCOUNTER — Encounter: Payer: Self-pay | Admitting: Family Medicine

## 2018-09-20 ENCOUNTER — Ambulatory Visit: Payer: Self-pay | Attending: Family Medicine | Admitting: Family Medicine

## 2018-09-20 ENCOUNTER — Ambulatory Visit: Payer: Self-pay | Attending: Family Medicine | Admitting: Licensed Clinical Social Worker

## 2018-09-20 VITALS — BP 121/82 | HR 96 | Temp 98.9°F | Resp 18 | Ht 62.0 in | Wt 195.0 lb

## 2018-09-20 DIAGNOSIS — K219 Gastro-esophageal reflux disease without esophagitis: Secondary | ICD-10-CM

## 2018-09-20 DIAGNOSIS — R739 Hyperglycemia, unspecified: Secondary | ICD-10-CM

## 2018-09-20 DIAGNOSIS — F419 Anxiety disorder, unspecified: Secondary | ICD-10-CM

## 2018-09-20 DIAGNOSIS — R1084 Generalized abdominal pain: Secondary | ICD-10-CM

## 2018-09-20 DIAGNOSIS — N912 Amenorrhea, unspecified: Secondary | ICD-10-CM

## 2018-09-20 DIAGNOSIS — R1024 Suprapubic pain: Secondary | ICD-10-CM

## 2018-09-20 DIAGNOSIS — F331 Major depressive disorder, recurrent, moderate: Secondary | ICD-10-CM

## 2018-09-20 DIAGNOSIS — R350 Frequency of micturition: Secondary | ICD-10-CM

## 2018-09-20 DIAGNOSIS — R1013 Epigastric pain: Secondary | ICD-10-CM

## 2018-09-20 DIAGNOSIS — R102 Pelvic and perineal pain: Secondary | ICD-10-CM

## 2018-09-20 DIAGNOSIS — J309 Allergic rhinitis, unspecified: Secondary | ICD-10-CM

## 2018-09-20 DIAGNOSIS — R8290 Unspecified abnormal findings in urine: Secondary | ICD-10-CM

## 2018-09-20 LAB — POCT URINALYSIS DIPSTICK
Glucose, UA: NEGATIVE
Ketones, UA: NEGATIVE
Leukocytes, UA: NEGATIVE
Nitrite, UA: NEGATIVE
Protein, UA: POSITIVE — AB
Spec Grav, UA: 1.02
Urobilinogen, UA: 1 U/dL
pH, UA: 6.5

## 2018-09-20 MED ORDER — PANTOPRAZOLE SODIUM 40 MG PO TBEC: 40.0000 mg | DELAYED_RELEASE_TABLET | Freq: Every day | ORAL | 4 refills | Status: DC

## 2018-09-20 MED ORDER — SULFAMETHOXAZOLE-TRIMETHOPRIM 800-160 MG PO TABS: 1.0000 | ORAL_TABLET | Freq: Two times a day (BID) | ORAL | 0 refills | Status: AC

## 2018-09-20 MED ORDER — CETIRIZINE HCL 10 MG PO TABS: 10.0000 mg | ORAL_TABLET | Freq: Every day | ORAL | 6 refills | Status: DC

## 2018-09-20 NOTE — Progress Notes (Signed)
Subjective:    Patient ID: Amy Salinas, female    DOB: 04-Dec-1985, 33 y.o.   MRN: [id number]  HPI       33 year old female seen status post emergency department visit on 08/23/2018 secondary to complaint of burning epigastric abdominal pain, cough and nausea along with 2 days of urinary frequency.   Patient reported a history of GERD but was out of medications.  Patient had resolution of pain with Pepcid and Maalox given in the ED.  Patient also had blood work done in the emergency department and patient had moderate blood on urinalysis and patient with elevated glucose of 156.      Patient still with some generalized abdominal pain.  Patient started use of Pepcid which was prescribed at emergency department visit however she is out of this medication.  She denies any nausea.  Patient feels as if she is having some burping/belching and occasional backwash of bad tasting liquid into her mouth/throat.  Patient has more abdominal discomfort in her mid upper abdomen as well as in the mid lower abdomen.  Patient also feels as if she is urinating more frequently.  Patient denies any past diagnosis of diabetes.  Patient denies any increased thirst but does have increased hunger.  Patient currently with some increased nasal congestion and postnasal drainage which she attributes to her chronic allergic rhinitis.  Patient also has some fatigue.  She denies any fever or chills, no headache or dizziness.  Past Medical History:  Diagnosis Date  . Bipolar 1 disorder (HCC)   . GERD (gastroesophageal reflux disease)   . PTSD (post-traumatic stress disorder)   . Schizoaffective disorder, bipolar type [facility](HCC)    Past Surgical History:  Procedure Laterality Date  . biopsy of stomach    . INDUCED ABORTION  2009   Family History  Problem Relation Age of Onset  . Breast cancer Mother   . Hypertension Father   . Diabetes Father   . Prostate cancer Father   . Colon cancer Neg Hx    Social History   Tobacco  Use  . Smoking status: Current Every Day Smoker    Packs/day: 0.25    Types: Cigarettes  . Smokeless tobacco: Never Used  Substance Use Topics  . Alcohol use: Yes    Comment: occ  . Drug use: No   Allergies  Allergen Reactions  . Other Anaphylaxis    broccoli    Review of Systems  Constitutional: Positive for fatigue. Negative for chills and fever.  HENT: Positive for congestion and postnasal drip. Negative for rhinorrhea.   Eyes: Negative for photophobia and visual disturbance.  Respiratory: Negative for cough and shortness of breath.   Gastrointestinal: Positive for abdominal pain (c/o abdominal discomfort). Negative for blood in stool, constipation, diarrhea, nausea and vomiting.  Endocrine: Positive for polyphagia and polyuria. Negative for polydipsia.  Genitourinary: Positive for frequency. Negative for dysuria, flank pain and urgency.  Musculoskeletal: Negative for arthralgias and back pain.  Neurological: Negative for dizziness and headaches.  Hematological: Negative for adenopathy. Does not bruise/bleed easily.       Objective:   Physical Exam Vitals signs and nursing note reviewed.  Constitutional:      General: She is not in acute distress.    Appearance: Normal appearance. She is obese. She is not ill-appearing.  HENT:     Head: Normocephalic and atraumatic.     Right Ear: Tympanic membrane, ear canal and external ear normal.     Left Ear:  Tympanic membrane, ear canal and external ear normal.     Nose: Congestion and rhinorrhea (mild and clear) present.     Mouth/Throat:     Mouth: Mucous membranes are moist.     Pharynx: Posterior oropharyngeal erythema (mild posterior pharynx erythema) present. No oropharyngeal exudate.  Eyes:     Extraocular Movements: Extraocular movements intact.     Conjunctiva/sclera: Conjunctivae normal.  Neck:     Musculoskeletal: Normal range of motion and neck supple.  Cardiovascular:     Rate and Rhythm: Normal rate and regular  rhythm.  Pulmonary:     Effort: Pulmonary effort is normal.     Breath sounds: Normal breath sounds.  Abdominal:     General: Bowel sounds are normal.     Palpations: Abdomen is soft.     Tenderness: There is abdominal tenderness (mild suprapubic and epigastric discomfort to palpation). There is no right CVA tenderness, left CVA tenderness, guarding or rebound.  Musculoskeletal:        General: No tenderness.     Right lower leg: No edema.     Left lower leg: No edema.  Lymphadenopathy:     Cervical: No cervical adenopathy.  Skin:    General: Skin is warm and dry.  Neurological:     General: No focal deficit present.     Mental Status: She is alert and oriented to person, place, and time.  Psychiatric:        Mood and Affect: Mood normal.        Behavior: Behavior normal.    BP 121/82 (BP Location: Left Arm, Patient Position: Sitting, Cuff Size: Large)   Pulse 96   Temp 98.9 F (37.2 C) (Oral)   Resp 18   Ht 5\' 2"  (1.575 m)   Wt 195 lb (88.5 kg)   SpO2 100%   BMI 35.67 kg/m         Assessment & Plan:  1. Generalized abdominal pain Patient with complaint of generalized abdominal pain and on exam, patient with some mild epigastric and suprapubic discomfort to palpation.  Patient will have urinalysis as patient did have some hematuria or emergency department visit.  Patient will also have BMP to check electrolytes - Urinalysis Dipstick - Basic Metabolic Panel  2. Epigastric pain Patient reports appointment with gastroenterology which she is encouraged to keep.  Prescription provided for pantoprazole and patient encouraged to avoid spicy/greasy foods as well as avoidance of late night eating and avoidance of known trigger foods  - pantoprazole (PROTONIX) 40 MG tablet; Take 1 tablet (40 mg total) by mouth daily. To decrease stomach acid  Dispense: 30 tablet; Refill: 4  3. Suprapubic discomfort Will check UA to look for possible UTI  4. Urinary frequency Patient has had  complaint of urinary frequency and had elevated blood sugar at recent ED visit.  Patient with glucose of 156 on 08/23/2018.  Will check hemoglobin A1c and BMP to look for elevated blood sugar which may can contribute to urinary frequency and patient will also have urinalysis to look for urinary tract infection or glucosuria - Basic Metabolic Panel - Hemoglobin A1c - sulfamethoxazole-trimethoprim (BACTRIM DS,SEPTRA DS) 800-160 MG tablet; Take 1 tablet by mouth 2 (two) times daily for 3 days.  Dispense: 6 tablet; Refill: 0  5. Elevated blood sugar level Glucose of 156 on patient's 08/23/18 ED labs, will check BMP and Hgb A1c - Basic Metabolic Panel - Hemoglobin A1c  6. Absent menses Will check POCT pregnancy as patient reports  absent menses - POCT urine pregnancy  7. Gastroesophageal reflux disease, esophagitis presence not specified RX for pantoprazole and follow-up with GI. Avoid known trigger foods and avoidance of late night eating - pantoprazole (PROTONIX) 40 MG tablet; Take 1 tablet (40 mg total) by mouth daily. To decrease stomach acid  Dispense: 30 tablet; Refill: 4  8. Abnormal urinalysis Patient with abnormal findings on UA and urine will be sent for culture and being treated with Septra DS twice daily for 3 days while culture is pending - Urine Culture - sulfamethoxazole-trimethoprim (BACTRIM DS,SEPTRA DS) 800-160 MG tablet; Take 1 tablet by mouth 2 (two) times daily for 3 days.  Dispense: 6 tablet; Refill: 0  9. Allergic rhinitis, unspecified seasonality, unspecified trigger Refill of Zyrtec for continued treatment of allergic rhinitis - cetirizine (ZYRTEC) 10 MG tablet; Take 1 tablet (10 mg total) by mouth daily. At bedtime to help with runny nose  Dispense: 30 tablet; Refill: 6  An After Visit Summary was printed and given to the patient.  Allergies as of 09/20/2018      Reactions   Other Anaphylaxis   broccoli      Medication List       Accurate as of September 20, 2018 11:59 PM. Always use your most recent med list.        acetaminophen-codeine 300-30 MG tablet Commonly known as:  TYLENOL #3 Take 1 tablet by mouth every 6 (six) hours as needed for moderate pain.   alum & mag hydroxide-simeth 400-400-40 MG/5ML suspension Commonly known as:  Maalox Max Take 10 mLs by mouth every 6 (six) hours as needed for indigestion.   cetirizine 10 MG tablet Commonly known as:  ZYRTEC Take 1 tablet (10 mg total) by mouth daily. At bedtime to help with runny nose   cyclobenzaprine 10 MG tablet Commonly known as:  FLEXERIL Take 10 mg by mouth 3 (three) times daily as needed for muscle spasms.   dicyclomine 20 MG tablet Commonly known as:  BENTYL Take 1 tablet (20 mg total) by mouth 2 (two) times daily. As needed for abdominal pain   divalproex 500 MG DR tablet Commonly known as:  DEPAKOTE Take 500 mg by mouth 3 (three) times daily.   Latuda 120 MG Tabs Generic drug:  Lurasidone HCl Take 120 mg by mouth daily.   mirtazapine 30 MG tablet Commonly known as:  REMERON Take 30 mg by mouth at bedtime.   pantoprazole 40 MG tablet Commonly known as:  PROTONIX Take 1 tablet (40 mg total) by mouth daily. To decrease stomach acid   sulfamethoxazole-trimethoprim 800-160 MG tablet Commonly known as:  BACTRIM DS,SEPTRA DS Take 1 tablet by mouth 2 (two) times daily for 3 days.      Return in about 2 weeks (around 10/04/2018) for abdominal pain.

## 2018-09-22 LAB — URINE CULTURE

## 2018-09-22 NOTE — BH Specialist Note (Signed)
Integrated Behavioral Health Initial Visit  MRN: [id number] Name: Amy Salinas  Number of Integrated Behavioral Health Clinician visits:: 1/6 Session Start time: 3:30 PM  Session End time: 4:00 PM Total time: 30 minutes  Type of Service: Integrated Behavioral Health- Individual/Family Interpretor:No. Interpretor Name and Language: N/A   Warm Hand Off Completed.       SUBJECTIVE: Amy Salinas is a 33 y.o. female accompanied by self Patient was referred by Dr. Jillyn Hidden for depression and anxiety. Patient reports the following symptoms/concerns: Pt reports diagnosis of Schizoaffective, Bipolar Disorder I, and PTSD Duration of problem: Ongoing; Severity of problem: moderate  OBJECTIVE: Mood: Appropriate and Affect: Appropriate Risk of harm to self or others: No plan to harm self or others Pt scored positive on phq9; however, denies current SI/HI/AVH Protective factors were identified, safety plan discussed, and crisis intervention resources provided  LIFE CONTEXT: Family and Social: Pt resides with father. School/Work: Pt is employed at two jobs. She is currently uninsured Self-Care: Pt receives psychotherapy and medication management through [facility]. She also has a job Psychologist, occupational to assist with obtaining appropriate employment. Pt smokes three cigarettes daily to assist with stress management Life Changes: Pt has mental health conditions that can be difficult managing   GOALS ADDRESSED: Patient will: 1. Reduce symptoms of: agitation, anxiety and depression 2. Increase knowledge and/or ability of: coping skills and healthy habits  3. Demonstrate ability to: Increase healthy adjustment to current life circumstances and Increase motivation to adhere to plan of care  INTERVENTIONS: Interventions utilized: Mindfulness or Management consultant, Supportive Counseling, Psychoeducation and/or Health Education and Link to [employer]  Standardized Assessments completed: GAD-7 and PHQ 2&9  with C-SSRS  ASSESSMENT: Patient currently experiencing stress triggered by difficulty managing mental and physical health conditions.  Pt reports diagnosis of Schizoaffective, Bipolar Disorder I, and PTSD. Pt scored positive on phq9; however, denies current SI/HI/AVH Protective factors were identified, safety plan discussed, and crisis intervention resources provided.   Patient receives psychotherapy and medication management through [facility]. She also has a job Psychologist, occupational to assist with obtaining appropriate employment. LCSWA discussed grounding strategies with patient to assist with managing anger/irritability. She has an upcoming appointment with new therapist on 09/24/2018. Pt was strongly encouraged to schedule an appointment with financial counseling to assist with medical coverage.  PLAN: 1. Follow up with behavioral health clinician on : Pt was encouraged to contact LCSWA if symptoms worsen or fail to improve to schedule behavioral appointments at [facility]. 2. Behavioral recommendations: LCSWA recommends that pt apply healthy coping skills discussed, comply with medication management, follow up with [facility] and utilize provided resources.  3. Referral(s): Integrated Behavioral Health Services (In Clinic) 4. "From scale of 1-10, how likely are you to follow plan?":   Bridgett Larsson, LCSW 09/23/2018 9:54 AM

## 2018-09-24 ENCOUNTER — Telehealth: Payer: Self-pay | Admitting: *Deleted

## 2018-09-24 NOTE — Telephone Encounter (Signed)
-----   Message from [name], MD sent at 09/22/2018  4:23 PM EST ----- Mixed urogenital flora on urine culture. No additional antibiotic therapy needed

## 2018-09-24 NOTE — Telephone Encounter (Signed)
Patient verified DOB Patient is aware of no other intervention being needed.

## 2018-10-15 ENCOUNTER — Ambulatory Visit: Payer: Medicaid Other | Admitting: Family Medicine

## 2018-11-15 ENCOUNTER — Ambulatory Visit: Payer: Medicaid Other | Admitting: Family Medicine

## 2018-12-13 ENCOUNTER — Ambulatory Visit: Payer: Self-pay | Admitting: Family Medicine

## 2018-12-13 ENCOUNTER — Other Ambulatory Visit: Payer: Self-pay

## 2019-02-14 ENCOUNTER — Ambulatory Visit: Payer: Self-pay | Admitting: Family Medicine

## 2019-02-21 ENCOUNTER — Other Ambulatory Visit: Payer: Self-pay

## 2019-02-21 ENCOUNTER — Encounter: Payer: Self-pay | Admitting: Family Medicine

## 2019-02-21 ENCOUNTER — Telehealth: Payer: Self-pay | Admitting: *Deleted

## 2019-02-21 ENCOUNTER — Ambulatory Visit: Payer: Self-pay | Attending: Family Medicine | Admitting: Family Medicine

## 2019-02-21 VITALS — BP 134/82 | HR 87 | Ht 62.0 in | Wt 186.8 lb

## 2019-02-21 DIAGNOSIS — K219 Gastro-esophageal reflux disease without esophagitis: Secondary | ICD-10-CM

## 2019-02-21 DIAGNOSIS — K529 Noninfective gastroenteritis and colitis, unspecified: Secondary | ICD-10-CM

## 2019-02-21 DIAGNOSIS — R739 Hyperglycemia, unspecified: Secondary | ICD-10-CM

## 2019-02-21 DIAGNOSIS — F430 Acute stress reaction: Secondary | ICD-10-CM

## 2019-02-21 DIAGNOSIS — R1084 Generalized abdominal pain: Secondary | ICD-10-CM

## 2019-02-21 DIAGNOSIS — F411 Generalized anxiety disorder: Secondary | ICD-10-CM

## 2019-02-21 MED ORDER — PANTOPRAZOLE SODIUM 40 MG PO TBEC: 40.0000 mg | DELAYED_RELEASE_TABLET | Freq: Every day | ORAL | 4 refills | Status: DC

## 2019-02-21 MED ORDER — TRAZODONE HCL 50 MG PO TABS: 25.0000 mg | ORAL_TABLET | Freq: Every evening | ORAL | 3 refills | Status: DC | PRN

## 2019-02-21 MED ORDER — DICYCLOMINE HCL 20 MG PO TABS: 20.0000 mg | ORAL_TABLET | Freq: Three times a day (TID) | ORAL | 0 refills | Status: DC

## 2019-02-21 NOTE — Progress Notes (Signed)
Established Patient Office Visit  Subjective:  Patient ID: Amy Salinas, female    DOB: 01/23/1986  Age: 33 y.o. MRN: [id number]  CC:  Chief Complaint  Patient presents with  . Medication Refill  . Abdominal Pain    HPI Amy HighlandMolly C Kolek presents secondary to complaint of generalized abdominal pain.  She also states that she has been stressed out and anxious because her mother has end-stage COPD and was placed on a ventilator and can now not be weaned from the ventilator.  Her mother has remained unresponsive.  Patient states that the current plan is for her mother to be placed in a hospice facility and since she will not be on a ventilator, she will pass away.  Patient states that she has several siblings and not all of the siblings are in agreement as to the care plans for her mother.  Patient reports that she has been having some issues with sleep as well as anxiety while dealing with her mother's declining health and upcoming death.       Patient reports that she continues to have issues with generalized abdominal pain.  She did follow-up with gastroenterology but she does not think that they were really sure why she was having the pain either.  She has been taking the Protonix that was prescribed.  She also reports ongoing issues with daily diarrhea.  She states that she usually has diarrhea every morning between 7 and 9 AM.  Patient has not been awakened due to the urge to have a bowel movement/diarrhea.  She has seen no blood in the stool and no black stools.  Morning diarrhea is very watery.  She also has generalized dull and crampy abdominal pain which comes and goes.  Patient states that sometimes eating will help but sometimes eating will make the pain worse therefore she has not been eating very much throughout the day.  Patient occasionally has green appearing stool.  She denies any recent issues with nausea.  She has felt fatigued.  She denies any urinary frequency or dysuria.  Past  Medical History:  Diagnosis Date  . Bipolar 1 disorder (HCC)   . GERD (gastroesophageal reflux disease)   . PTSD (post-traumatic stress disorder)   . Schizoaffective disorder, bipolar type [facility](HCC)     Past Surgical History:  Procedure Laterality Date  . biopsy of stomach    . INDUCED ABORTION  2009    Family History  Problem Relation Age of Onset  . Breast cancer Mother   . Hypertension Father   . Diabetes Father   . Prostate cancer Father   . Colon cancer Neg Hx     Social History   Tobacco Use  . Smoking status: Current Every Day Smoker    Packs/day: 0.25    Types: Cigarettes  . Smokeless tobacco: Never Used  Substance Use Topics  . Alcohol use: Yes    Comment: occ  . Drug use: No    Outpatient Medications Prior to Visit  Medication Sig Dispense Refill  . acetaminophen-codeine (TYLENOL #3) 300-30 MG tablet Take 1 tablet by mouth every 6 (six) hours as needed for moderate pain. 10 tablet 0  . alum & mag hydroxide-simeth (MAALOX MAX) 400-400-40 MG/5ML suspension Take 10 mLs by mouth every 6 (six) hours as needed for indigestion. 355 mL 0  . cetirizine (ZYRTEC) 10 MG tablet Take 1 tablet (10 mg total) by mouth daily. At bedtime to help with runny nose 30 tablet 6  .  cyclobenzaprine (FLEXERIL) 10 MG tablet Take 10 mg by mouth 3 (three) times daily as needed for muscle spasms.    Marland Kitchen. dicyclomine (BENTYL) 20 MG tablet Take 1 tablet (20 mg total) by mouth 2 (two) times daily. As needed for abdominal pain 60 tablet 0  . divalproex (DEPAKOTE) 500 MG DR tablet Take 500 mg by mouth 3 (three) times daily.    . Lurasidone HCl (LATUDA) 120 MG TABS Take 120 mg by mouth daily.     . mirtazapine (REMERON) 30 MG tablet Take 30 mg by mouth at bedtime.    . pantoprazole (PROTONIX) 40 MG tablet Take 1 tablet (40 mg total) by mouth daily. To decrease stomach acid 30 tablet 4   No facility-administered medications prior to visit.     Allergies  Allergen Reactions  . Other Anaphylaxis     broccoli    ROS Review of Systems  Constitutional: Positive for fatigue. Negative for chills and fever.  HENT: Negative for sore throat and trouble swallowing.   Respiratory: Negative for cough and shortness of breath.   Cardiovascular: Negative for chest pain, palpitations and leg swelling.  Gastrointestinal: Positive for abdominal pain and diarrhea. Negative for blood in stool, constipation and nausea.  Endocrine: Negative for polydipsia, polyphagia and polyuria.  Genitourinary: Negative for dysuria and frequency.  Musculoskeletal: Negative for back pain and gait problem.  Neurological: Negative for dizziness and headaches.  Hematological: Negative for adenopathy. Does not bruise/bleed easily.  Psychiatric/Behavioral: Positive for sleep disturbance. Negative for self-injury and suicidal ideas. The patient is nervous/anxious.       Objective:    Physical Exam  Constitutional: She is oriented to person, place, and time. She appears well-developed and well-nourished.  Overweight for height/obese female in no acute distress.  Patient does appear slightly fatigued versus having a slightly flattened affect  Cardiovascular: Normal rate and regular rhythm.  Pulmonary/Chest: Effort normal and breath sounds normal.  Abdominal: Soft. She exhibits distension. There is abdominal tenderness. There is no rebound and no guarding.  Patient with complaint of generalized abdominal tenderness as well as increased tenderness with palpation in the right upper quadrant.  No rebound or guarding  Musculoskeletal:        General: No tenderness or edema.  Neurological: She is alert and oriented to person, place, and time.  Skin: Skin is warm and dry.  Psychiatric: Her behavior is normal. Thought content normal.  Slightly flattened affect  Vitals reviewed.   BP 134/82 (BP Location: Right Arm, Patient Position: Sitting, Cuff Size: Large)   Pulse 87   Ht 5\' 2"  (1.575 m)   Wt 186 lb 12.8 oz (84.7 kg)    SpO2 98%   BMI 34.17 kg/m  Wt Readings from Last 3 Encounters:  02/21/19 186 lb 12.8 oz (84.7 kg)  09/20/18 195 lb (88.5 kg)  08/23/18 190 lb (86.2 kg)     Health Maintenance Due  Topic Date Due  . HIV Screening  11/18/2001  . TETANUS/TDAP  11/18/2005  . PAP SMEAR-Modifier  11/19/2007    Lab Results  Component Value Date   TSH 0.551 05/25/2018   Lab Results  Component Value Date   WBC 7.2 08/23/2018   HGB 12.8 08/23/2018   HCT 42.3 08/23/2018   MCV 77.3 (L) 08/23/2018   PLT 282 08/23/2018   Lab Results  Component Value Date   NA 139 08/23/2018   K 3.9 08/23/2018   CO2 23 08/23/2018   GLUCOSE 156 (H) 08/23/2018   BUN  10 08/23/2018   CREATININE 1.01 (H) 08/23/2018   BILITOT 0.3 08/23/2018   ALKPHOS 54 08/23/2018   AST 17 08/23/2018   ALT 14 08/23/2018   PROT 8.0 08/23/2018   ALBUMIN 4.4 08/23/2018   CALCIUM 9.4 08/23/2018   ANIONGAP 12 08/23/2018   No results found for: CHOL No results found for: HDL No results found for: LDLCALC No results found for: TRIG No results found for: CHOLHDL No results found for: HGBA1C    Assessment & Plan:  1. Generalized abdominal pain Patient with continued complaint of generalized abdominal pain as well as complaint of chronic diarrhea.  Will check electrolytes and liver function secondary to her complaints of abdominal pain or diarrhea.  Patient will have lipase at today's visit due to epigastric pain.  CBC to look for any anemia related to blood loss or elevated white blood cell count.  She will also be scheduled for right upper quadrant ultrasound to look for fatty liver or gallbladder disease which may be causing or contributing to her diarrhea and abdominal pain.  Prescription for Bentyl which she has taken in the past to see if this helps with her chronic abdominal pain. - Comprehensive metabolic panel - Lipase - CBC with Differential - US Abdomen Limited RUQ; Future - dicyclomine (BENTYL) 20 MG tablet; Take 1 tablet  (20 mg total) by mouth 4 (four) times daily -  before meals and at bedtime. As needed for abdominal pain  Dispense: 120 tablet; Refill: 0  2. Gastroesophageal reflux disease, esophagitis presence not specified Continue use of pantoprazole as well as avoidance of late night eating and avoidance of known trigger foods - Comprehensive metabolic panel - Lipase - Hemoglobin A1c - pantoprazole (PROTONIX) 40 MG tablet; Take 1 tablet (40 mg total) by mouth daily. To decrease stomach acid  Dispense: 30 tablet; Refill: 4  3. Elevated blood sugar level Patient with elevated blood sugar level unless BMP with glucose of 150.  She will have hemoglobin A1c and recheck glucose and electrolytes as part of CMP  4. Chronic diarrhea Patient with complaint of chronic diarrhea and she will have right upper quadrant ultrasound ordered to look for fatty liver or gallbladder abnormality which may be contributing to her diarrhea.  If patient's ultrasound is normal, discussed HIDA scan for additional evaluation of gallbladder function.  Patient will have CMP to look for electrolyte abnormality or liver disorder - US Abdomen Limited RUQ; Future  5. Anxiety as acute reaction to exceptional stress Patient with complaint of recent increase in anxiety and difficulty sleeping due to mother's declining health status and impending death.  Patient is encouraged to seek counseling through hospice.  Prescription for provided for trazodone 50 mg at bedtime to help with sleep and anxiety - traZODone (DESYREL) 50 MG tablet; Take 0.5-1 tablets (25-50 mg total) by mouth at bedtime as needed for sleep.  Dispense: 30 tablet; Refill: 3   An After Visit Summary was printed and given to the patient.  Follow-up: Return in about 4 weeks (around 03/21/2019) for abdominal pain.   Antony Blackbird, MD

## 2019-02-21 NOTE — Progress Notes (Signed)
Med refills and abd pain the entire abd for years now  Per pt she mother is currently in the hospital about to pass away and is currently under a lot of stress.

## 2019-02-21 NOTE — Telephone Encounter (Signed)
Staff called and scheduled patient Ultrasound for July 2nd at 8 am arriving at 7:45 at the main entrance of [facility] with nothing to eat or dink past midnight. Patient agreed with appt and verbalized understanding.

## 2019-02-22 LAB — COMPREHENSIVE METABOLIC PANEL WITH GFR
ALT: 12 IU/L (ref 0–32)
AST: 17 IU/L (ref 0–40)
Albumin/Globulin Ratio: 1.9 (ref 1.2–2.2)
Albumin: 5 g/dL — ABNORMAL HIGH (ref 3.8–4.8)
Alkaline Phosphatase: 59 IU/L (ref 39–117)
BUN/Creatinine Ratio: 13 (ref 9–23)
BUN: 11 mg/dL (ref 6–20)
Bilirubin Total: 0.3 mg/dL (ref 0.0–1.2)
CO2: 20 mmol/L (ref 20–29)
Calcium: 10 mg/dL (ref 8.7–10.2)
Chloride: 104 mmol/L (ref 96–106)
Creatinine, Ser: 0.88 mg/dL (ref 0.57–1.00)
GFR calc Af Amer: 101 mL/min/1.73
GFR calc non Af Amer: 87 mL/min/1.73
Globulin, Total: 2.6 g/dL (ref 1.5–4.5)
Glucose: 68 mg/dL (ref 65–99)
Potassium: 4.3 mmol/L (ref 3.5–5.2)
Sodium: 140 mmol/L (ref 134–144)
Total Protein: 7.6 g/dL (ref 6.0–8.5)

## 2019-02-22 LAB — CBC WITH DIFFERENTIAL/PLATELET
Basophils Absolute: 0 x10E3/uL (ref 0.0–0.2)
Basos: 1 %
EOS (ABSOLUTE): 0.1 x10E3/uL (ref 0.0–0.4)
Eos: 1 %
Hematocrit: 39.6 % (ref 34.0–46.6)
Hemoglobin: 12.7 g/dL (ref 11.1–15.9)
Immature Grans (Abs): 0 x10E3/uL (ref 0.0–0.1)
Immature Granulocytes: 0 %
Lymphocytes Absolute: 3.9 x10E3/uL — ABNORMAL HIGH (ref 0.7–3.1)
Lymphs: 54 %
MCH: 24.1 pg — ABNORMAL LOW (ref 26.6–33.0)
MCHC: 32.1 g/dL (ref 31.5–35.7)
MCV: 75 fL — ABNORMAL LOW (ref 79–97)
Monocytes Absolute: 0.3 x10E3/uL (ref 0.1–0.9)
Monocytes: 5 %
Neutrophils Absolute: 2.7 x10E3/uL (ref 1.4–7.0)
Neutrophils: 39 %
Platelets: 247 x10E3/uL (ref 150–450)
RBC: 5.27 x10E6/uL (ref 3.77–5.28)
RDW: 15.7 % — ABNORMAL HIGH (ref 11.7–15.4)
WBC: 7.1 x10E3/uL (ref 3.4–10.8)

## 2019-02-22 LAB — HEMOGLOBIN A1C
Est. average glucose Bld gHb Est-mCnc: 114 mg/dL
Hgb A1c MFr Bld: 5.6 % (ref 4.8–5.6)

## 2019-02-22 LAB — LIPASE: Lipase: 23 U/L (ref 14–72)

## 2019-02-24 ENCOUNTER — Other Ambulatory Visit: Payer: Self-pay

## 2019-02-24 ENCOUNTER — Ambulatory Visit (HOSPITAL_COMMUNITY)
Admission: RE | Admit: 2019-02-24 | Discharge: 2019-02-24 | Disposition: A | Discharge status: Home / Self Care | Payer: Self-pay | Source: Ambulatory Visit | Attending: Family Medicine | Admitting: Family Medicine

## 2019-02-24 DIAGNOSIS — K529 Noninfective gastroenteritis and colitis, unspecified: Secondary | ICD-10-CM | POA: Insufficient documentation

## 2019-02-24 DIAGNOSIS — R1084 Generalized abdominal pain: Secondary | ICD-10-CM | POA: Insufficient documentation

## 2019-10-02 ENCOUNTER — Encounter (HOSPITAL_COMMUNITY): Payer: Self-pay

## 2019-10-02 ENCOUNTER — Emergency Department (HOSPITAL_COMMUNITY)
Admission: EM | Admit: 2019-10-02 | Discharge: 2019-10-02 | Disposition: A | Discharge status: Home / Self Care | Payer: Medicaid Other | Attending: Emergency Medicine | Admitting: Emergency Medicine

## 2019-10-02 ENCOUNTER — Emergency Department (HOSPITAL_COMMUNITY): Payer: Medicaid Other

## 2019-10-02 ENCOUNTER — Other Ambulatory Visit: Payer: Self-pay

## 2019-10-02 DIAGNOSIS — F17210 Nicotine dependence, cigarettes, uncomplicated: Secondary | ICD-10-CM | POA: Insufficient documentation

## 2019-10-02 DIAGNOSIS — S8002XA Contusion of left knee, initial encounter: Secondary | ICD-10-CM

## 2019-10-02 DIAGNOSIS — Y939 Activity, unspecified: Secondary | ICD-10-CM | POA: Insufficient documentation

## 2019-10-02 DIAGNOSIS — Z79899 Other long term (current) drug therapy: Secondary | ICD-10-CM | POA: Insufficient documentation

## 2019-10-02 DIAGNOSIS — Y999 Unspecified external cause status: Secondary | ICD-10-CM | POA: Insufficient documentation

## 2019-10-02 DIAGNOSIS — W010XXA Fall on same level from slipping, tripping and stumbling without subsequent striking against object, initial encounter: Secondary | ICD-10-CM | POA: Insufficient documentation

## 2019-10-02 DIAGNOSIS — Y929 Unspecified place or not applicable: Secondary | ICD-10-CM | POA: Insufficient documentation

## 2019-10-02 DIAGNOSIS — W19XXXA Unspecified fall, initial encounter: Secondary | ICD-10-CM

## 2019-10-02 MED ORDER — NAPROXEN 500 MG PO TABS
500.0000 mg | ORAL_TABLET | Freq: Once | ORAL | Status: AC
  Administered 2019-10-02: 18:00:00 500 mg via ORAL
  Filled 2019-10-02: qty 1

## 2019-10-02 MED ORDER — NAPROXEN 500 MG PO TABS: 500.0000 mg | ORAL_TABLET | Freq: Two times a day (BID) | ORAL | 0 refills | Status: DC

## 2019-10-02 NOTE — Discharge Instructions (Signed)
Thank you for allowing me to care for you today. Please return to the emergency department if you have new or worsening symptoms. Take your medications as instructed.  ° °

## 2019-10-02 NOTE — ED Triage Notes (Signed)
Patient states she fell yesterday on slick ground. Patient denies hitting her head.  Patient c/o left knee pain, worse with weight bearing.

## 2019-10-02 NOTE — ED Provider Notes (Signed)
Amy Salinas Note   CSN: [id number] Arrival date & time: 10/02/19  1703     History Chief Complaint  Patient presents with  . Fall  . Knee Pain    Amy Salinas is a 34 y.o. female.  Patient a 34 year old female with past medical history of bipolar disorder, PTSD, schizoaffective disorder presenting to the emergency department for left knee pain after a fall.  Patient states yesterday she slipped on the wet pavement and landed directly on her left knee.  Reports she has been able to ambulate but needs assistance since then.  Reports pain is located in the anterior knee.  She has not tried anything for relief.        Past Medical History:  Diagnosis Date  . Bipolar 1 disorder ([facility])   . GERD (gastroesophageal reflux disease)   . PTSD (post-traumatic stress disorder)   . Schizoaffective disorder, bipolar type ([facility])     There are no problems to display for this patient.   Past Surgical History:  Procedure Laterality Date  . biopsy of stomach    . INDUCED ABORTION  2009     OB History   No obstetric history on file.     Family History  Problem Relation Age of Onset  . Breast cancer Mother   . Hypertension Father   . Diabetes Father   . Prostate cancer Father   . Colon cancer Neg Hx     Social History   Tobacco Use  . Smoking status: Current Every Day Smoker    Packs/day: 0.25    Types: Cigarettes  . Smokeless tobacco: Never Used  Substance Use Topics  . Alcohol use: Yes    Comment: occ  . Drug use: No    Home Medications Prior to Admission medications   Medication Sig Start Date End Date Taking? Authorizing Salinas  acetaminophen-codeine (TYLENOL #3) 300-30 MG tablet Take 1 tablet by mouth every 6 (six) hours as needed for moderate pain. 09/08/16   [name], MD  alum & mag hydroxide-simeth (MAALOX MAX) 400-400-40 MG/5ML suspension Take 10 mLs by mouth every 6 (six) hours as needed for indigestion.  08/23/18   Amy, [name], PA-C  cetirizine (ZYRTEC) 10 MG tablet Take 1 tablet (10 mg total) by mouth daily. At bedtime to help with runny nose 09/20/18   [name], [name], MD  cyclobenzaprine (FLEXERIL) 10 MG tablet Take 10 mg by mouth 3 (three) times daily as needed for muscle spasms.    Salinas, Historical, MD  dicyclomine (BENTYL) 20 MG tablet Take 1 tablet (20 mg total) by mouth 4 (four) times daily -  before meals and at bedtime. As needed for abdominal pain 02/21/19   [name], [name], MD  divalproex (DEPAKOTE) 500 MG DR tablet Take 500 mg by mouth 3 (three) times daily.    Salinas, Historical, MD  Lurasidone HCl (LATUDA) 120 MG TABS Take 120 mg by mouth daily.     Salinas, Historical, MD  mirtazapine (REMERON) 30 MG tablet Take 30 mg by mouth at bedtime.    Salinas, Historical, MD  naproxen (NAPROSYN) 500 MG tablet Take 1 tablet (500 mg total) by mouth 2 (two) times daily. 10/02/19   Amy Apley, PA-C  pantoprazole (PROTONIX) 40 MG tablet Take 1 tablet (40 mg total) by mouth daily. To decrease stomach acid 02/21/19   [name], [name], MD  traZODone (DESYREL) 50 MG tablet Take 0.5-1 tablets (25-50 mg total) by mouth at bedtime as needed for  sleep. 02/21/19   [name], MD    Allergies    Other  Review of Systems   Review of Systems  Constitutional: Negative for chills and fever.  Musculoskeletal: Positive for arthralgias and joint swelling. Negative for back pain, gait problem and myalgias.  Skin: Negative for rash and wound.  Neurological: Negative for dizziness, light-headedness and headaches.  Hematological: Does not bruise/bleed easily.    Physical Exam Updated Vital Signs BP 127/84 (BP Location: Left Arm)   Pulse (!) 102   Temp 98.4 F (36.9 C) (Oral)   Resp 18   Ht 5' 0.25" (1.53 m)   SpO2 100%   BMI 36.18 kg/m   Physical Exam Vitals and nursing note reviewed.  Constitutional:      General: She is not in acute distress.    Appearance: Normal appearance. She is not  ill-appearing, toxic-appearing or diaphoretic.  HENT:     Head: Normocephalic and atraumatic. No raccoon eyes, Battle's sign, abrasion, contusion, masses or laceration.     Jaw: There is normal jaw occlusion.     Nose: Nose normal.     Mouth/Throat:     Mouth: Mucous membranes are moist.  Eyes:     Conjunctiva/sclera: Conjunctivae normal.  Neck:     Trachea: Trachea normal.  Cardiovascular:     Rate and Rhythm: Normal rate and regular rhythm.  Pulmonary:     Effort: Pulmonary effort is normal.  Musculoskeletal:     Cervical back: Full passive range of motion without pain. No edema. No pain with movement, spinous process tenderness or muscular tenderness.     Thoracic back: Normal.     Lumbar back: Normal.     Left lower leg: Tenderness present. No swelling, deformity, lacerations or bony tenderness. No edema.     Comments: Left knee with diffuse tenderness. Patient resists my exam. No swelling or signs of trauma. Pain with both flexion and extension. No gross laxity.  Skin:    General: Skin is warm and dry.     Capillary Refill: Capillary refill takes less than 2 seconds.  Neurological:     General: No focal deficit present.     Mental Status: She is alert and oriented to person, place, and time.  Psychiatric:        Mood and Affect: Mood normal.     ED Results / Procedures / Treatments   Labs (all labs ordered are listed, but only abnormal results are displayed) Labs Reviewed - No data to display  EKG None  Radiology DG Knee Complete 4 Views Left  Result Date: 10/02/2019 CLINICAL DATA:  Left knee pain since a fall yesterday. Initial encounter. EXAM: LEFT KNEE - COMPLETE 4+ VIEW COMPARISON:  None. FINDINGS: No evidence of fracture, dislocation, or joint effusion. No evidence of arthropathy or other focal bone abnormality. Soft tissues are unremarkable. IMPRESSION: Normal exam. Electronically Signed   By: Drusilla Kanner M.D.   On: 10/02/2019 17:38     Procedures Procedures (including critical care time)  Medications Ordered in ED Medications  naproxen (NAPROSYN) tablet 500 mg (500 mg Oral Given 10/02/19 1814)    ED Course  I have reviewed the triage vital signs and the nursing notes.  Pertinent labs & imaging results that were available during my care of the patient were reviewed by me and considered in my medical decision making (see chart for details).    MDM Rules/Calculators/A&P  Based on review of vitals, medical screening exam, lab work and/or imaging, there does not appear to be an acute, emergent etiology for the patient's symptoms. Counseled pt on good return precautions and encouraged both PCP and ED follow-up as needed.  Prior to discharge, I also discussed incidental imaging findings with patient in detail and advised appropriate, recommended follow-up in detail.  Clinical Impression: 1. Fall, initial encounter   2. Contusion of left knee, initial encounter     Disposition: Discharge  Prior to providing a prescription for a controlled substance, I independently reviewed the patient's recent prescription history on the [place] Controlled Substance Reporting System. The patient had no recent or regular prescriptions and was deemed appropriate for a brief, less than 3 day prescription of narcotic for acute analgesia.  This note was prepared with assistance of Conservation officer, historic buildings. Occasional wrong-word or sound-a-like substitutions may have occurred due to the inherent limitations of voice recognition software.  Final Clinical Impression(s) / ED Diagnoses Final diagnoses:  Fall, initial encounter  Contusion of left knee, initial encounter    Rx / DC Orders ED Discharge Orders         Ordered    naproxen (NAPROSYN) 500 MG tablet  2 times daily     10/02/19 1816           Jeral Pinch 10/02/19 1816    Terrilee Files, MD 10/03/19 [phone]

## 2019-11-23 ENCOUNTER — Ambulatory Visit: Payer: Medicaid Other | Attending: Internal Medicine

## 2019-11-23 DIAGNOSIS — Z23 Encounter for immunization: Secondary | ICD-10-CM

## 2019-11-23 NOTE — Progress Notes (Signed)
   Covid-19 Vaccination Clinic  Name:  MEHR DEPAOLI    MRN: [id number] DOB: 23-Mar-1986  11/23/2019  Ms. Schwalm was observed post Covid-19 immunization for 15 minutes without incident. She was provided with Vaccine Information Sheet and instruction to access the V-Safe system.   Ms. Washington was instructed to call 911 with any severe reactions post vaccine: Marland Kitchen Difficulty breathing  . Swelling of face and throat  . A fast heartbeat  . A bad rash all over body  . Dizziness and weakness   Immunizations Administered    Name Date Dose VIS Date Route   Pfizer COVID-19 Vaccine 11/23/2019  5:07 PM 0.3 mL 08/05/2019 Intramuscular   Manufacturer: [employer], [employer]   Lot: [id number]   NDC: [address]-7

## 2019-12-01 ENCOUNTER — Other Ambulatory Visit: Payer: Self-pay

## 2019-12-01 ENCOUNTER — Ambulatory Visit: Payer: Medicaid Other | Attending: Family Medicine | Admitting: Physician Assistant

## 2019-12-01 DIAGNOSIS — L298 Other pruritus: Secondary | ICD-10-CM

## 2019-12-01 DIAGNOSIS — L299 Pruritus, unspecified: Secondary | ICD-10-CM

## 2019-12-01 DIAGNOSIS — J309 Allergic rhinitis, unspecified: Secondary | ICD-10-CM

## 2019-12-01 DIAGNOSIS — Z789 Other specified health status: Secondary | ICD-10-CM

## 2019-12-01 MED ORDER — CETIRIZINE HCL 10 MG PO TABS: 10.0000 mg | ORAL_TABLET | Freq: Every day | ORAL | 6 refills | Status: DC

## 2019-12-01 MED ORDER — FAMOTIDINE 20 MG PO TABS: 20.0000 mg | ORAL_TABLET | Freq: Two times a day (BID) | ORAL | 1 refills | Status: DC

## 2019-12-01 NOTE — Progress Notes (Signed)
Virtual Visit via Telephone Note  I connected with Amy Salinas on 12/01/19 at  9:10 AM EDT by telephone and verified that I am speaking with the correct person using two identifiers.   I discussed the limitations, risks, security and privacy concerns of performing an evaluation and management service by telephone and the availability of in person appointments. I also discussed with the patient that there may be a patient responsible charge related to this service. The patient expressed understanding and agreed to proceed.  PATIENT visit by telephone virtually in the context of Covid-19 pandemic. Patient location:  home My Location:  [facility] office Persons on the call:  Me and the patinet   History of Present Illness:  Received Covid vaccine 11/23/2019 in L arm-Pfizer.  No rash.  About 1 hour after vaccine into the L deltoid, she had a red spot come up on her R inner wrist.  She put antibiotic ointment on it and it went away.  No other "red spots" or rash of any kind.  No fever.  She does, however, complain of itching all over since the immunizations.  Says she is supposed to get her second one on April 28.  No SOB.  No difficulty swallowing.  No tongue swelling.      Observations/Objective:  NAD.  A&Ox3   Assessment and Plan: 1. Allergic rhinitis, unspecified seasonality, unspecified trigger - cetirizine (ZYRTEC) 10 MG tablet; Take 1 tablet (10 mg total) by mouth daily. At bedtime to help with runny nose  Dispense: 30 tablet; Refill: 6  2. Itching Possible SE of vaccine but does not sound like true "allergic reaction."  I have advised pepcid and zyrtec for the next 5-6 weeks daily and to take benadryl prior to next injection.  I have also advised that she discuss with the nurse at her health at work facility prior to next administration. - famotidine (PEPCID) 20 MG tablet; Take 1 tablet (20 mg total) by mouth 2 (two) times daily.  Dispense: 30 tablet; Refill: 1 - cetirizine (ZYRTEC) 10 MG  tablet; Take 1 tablet (10 mg total) by mouth daily. At bedtime to help with runny nose  Dispense: 30 tablet; Refill: 6  3. Unknown status of immunity to COVID-19 virus(no appropriate ICD-10 code for complaint) Possible SE of vaccine but does not sound like true "allergic reaction."  I have advised pepcid and zyrtec for the next 5-6 weeks daily and to take benadryl prior to next injection.  I have also advised that she discuss with the nurse at her health at work facility prior to next administration.   - famotidine (PEPCID) 20 MG tablet; Take 1 tablet (20 mg total) by mouth 2 (two) times daily.  Dispense: 30 tablet; Refill: 1 - cetirizine (ZYRTEC) 10 MG tablet; Take 1 tablet (10 mg total) by mouth daily. At bedtime to help with runny nose  Dispense: 30 tablet; Refill: 6    Follow Up Instructions: prn   I discussed the assessment and treatment plan with the patient. The patient was provided an opportunity to ask questions and all were answered. The patient agreed with the plan and demonstrated an understanding of the instructions.   The patient was advised to call back or seek an in-person evaluation if the symptoms worsen or if the condition fails to improve as anticipated.  I provided 11 minutes of non-face-to-face time during this encounter.   Georgian Co, PA-C

## 2019-12-21 ENCOUNTER — Ambulatory Visit: Payer: Medicaid Other | Attending: Internal Medicine

## 2019-12-21 DIAGNOSIS — Z23 Encounter for immunization: Secondary | ICD-10-CM

## 2019-12-21 NOTE — Progress Notes (Signed)
   Covid-19 Vaccination Clinic  Name:  Amy Salinas    MRN: [id number] DOB: 05-Feb-1986  12/21/2019  [name] was observed post Covid-19 immunization for 15 minutes without incident. She was provided with Vaccine Information Sheet and instruction to access the V-Safe system.   [name] was instructed to call 911 with any severe reactions post vaccine: Marland Kitchen Difficulty breathing  . Swelling of face and throat  . A fast heartbeat  . A bad rash all over body  . Dizziness and weakness   Immunizations Administered    Name Date Dose VIS Date Route   Pfizer COVID-19 Vaccine 12/21/2019  4:47 PM 0.3 mL 10/19/2018 Intramuscular   Manufacturer: [employer], [employer]   Lot: [id number]   NDC: [address]-5

## 2020-07-05 ENCOUNTER — Emergency Department (HOSPITAL_COMMUNITY): Payer: Self-pay

## 2020-07-05 ENCOUNTER — Encounter (HOSPITAL_COMMUNITY): Payer: Self-pay | Admitting: Emergency Medicine

## 2020-07-05 ENCOUNTER — Other Ambulatory Visit: Payer: Self-pay

## 2020-07-05 ENCOUNTER — Emergency Department (HOSPITAL_COMMUNITY)
Admission: EM | Admit: 2020-07-05 | Discharge: 2020-07-05 | Disposition: A | Discharge status: Home / Self Care | Payer: Self-pay | Attending: Emergency Medicine | Admitting: Emergency Medicine

## 2020-07-05 DIAGNOSIS — M79643 Pain in unspecified hand: Secondary | ICD-10-CM | POA: Insufficient documentation

## 2020-07-05 DIAGNOSIS — M79641 Pain in right hand: Secondary | ICD-10-CM

## 2020-07-05 DIAGNOSIS — F17210 Nicotine dependence, cigarettes, uncomplicated: Secondary | ICD-10-CM | POA: Insufficient documentation

## 2020-07-05 MED ORDER — ACETAMINOPHEN 325 MG PO TABS
650.0000 mg | ORAL_TABLET | Freq: Once | ORAL | Status: AC
  Administered 2020-07-05: 650 mg via ORAL
  Filled 2020-07-05: qty 2

## 2020-07-05 NOTE — ED Provider Notes (Addendum)
[facility]-EMERGENCY DEPT Provider Note   CSN: [id number] Arrival date & time: 07/05/20  1049     History Chief Complaint  Patient presents with  . Hand Pain    Amy Salinas is a 34 y.o. female history of schizoaffective disorder, PTSD, GERD, bipolar.  Patient presents today for bilateral hand pain ongoing for the past 5 months.  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  She denies any injury or trauma.  She describes a mild throbbing pain that begins around her bilateral index MCPs, occasionally will radiate up and down hand when pain is more severe, pain is aching and has no clear alleviating factors.  Denies fever/chills, fall/injury, numbness/tingling, weakness, swelling/color change, skin break, wrist pain, elbow pain, shoulder pain, neck pain or any additional concerns.  HPI     Past Medical History:  Diagnosis Date  . Bipolar 1 disorder ([facility])   . GERD (gastroesophageal reflux disease)   . PTSD (post-traumatic stress disorder)   . Schizoaffective disorder, bipolar type ([facility])     There are no problems to display for this patient.   Past Surgical History:  Procedure Laterality Date  . biopsy of stomach    . INDUCED ABORTION  2009     OB History   No obstetric history on file.     Family History  Problem Relation Age of Onset  . Breast cancer Mother   . Hypertension Father   . Diabetes Father   . Prostate cancer Father   . Colon cancer Neg Hx     Social History   Tobacco Use  . Smoking status: Current Every Day Smoker    Packs/day: 0.25    Types: Cigarettes  . Smokeless tobacco: Never Used  Vaping Use  . Vaping Use: Never used  Substance Use Topics  . Alcohol use: Yes    Comment: occ  . Drug use: No    Home Medications Prior to Admission medications   Medication Sig Start Date End Date Taking? Authorizing Provider  acetaminophen-codeine (TYLENOL #3) 300-30  MG tablet Take 1 tablet by mouth every 6 (six) hours as needed for moderate pain. 09/08/16   Lorre Nick, MD  alum & mag hydroxide-simeth (MAALOX MAX) 400-400-40 MG/5ML suspension Take 10 mLs by mouth every 6 (six) hours as needed for indigestion. Patient not taking: Reported on 12/01/2019 08/23/18   [name], [name], PA-C  cetirizine (ZYRTEC) 10 MG tablet Take 1 tablet (10 mg total) by mouth daily. At bedtime to help with runny nose 12/01/19   Anders Simmonds, PA-C  cyclobenzaprine (FLEXERIL) 10 MG tablet Take 10 mg by mouth 3 (three) times daily as needed for muscle spasms.    [provider]  dicyclomine (BENTYL) 20 MG tablet Take 1 tablet (20 mg total) by mouth 4 (four) times daily -  before meals and at bedtime. As needed for abdominal pain Patient not taking: Reported on 12/01/2019 02/21/19   [name], Hewitt Shorts, MD  divalproex (DEPAKOTE) 500 MG DR tablet Take 500 mg by mouth 3 (three) times daily.    [provider]  famotidine (PEPCID) 20 MG tablet Take 1 tablet (20 mg total) by mouth 2 (two) times daily. 12/01/19   Anders Simmonds, PA-C  Lurasidone HCl (LATUDA) 120 MG TABS Take 120 mg by mouth daily.     [provider]  mirtazapine (REMERON) 30 MG tablet Take 30 mg by mouth at  bedtime.    [provider]  naproxen (NAPROSYN) 500 MG tablet Take 1 tablet (500 mg total) by mouth 2 (two) times daily. Patient not taking: Reported on 12/01/2019 10/02/19   Arlyn DunningMcLean, Kelly A, PA-C  pantoprazole (PROTONIX) 40 MG tablet Take 1 tablet (40 mg total) by mouth daily. To decrease stomach acid Patient not taking: Reported on 12/01/2019 02/21/19   Cain SaupeFulp, [name], MD  traZODone (DESYREL) 50 MG tablet Take 0.5-1 tablets (25-50 mg total) by mouth at bedtime as needed for sleep. Patient not taking: Reported on 12/01/2019 02/21/19   Cain SaupeFulp, [name], MD    Allergies    Other  Review of Systems   Review of Systems  Constitutional: Negative.  Negative for chills and fever.  Musculoskeletal:  Positive for arthralgias (Bilateral hand). Negative for joint swelling and neck pain.  Neurological: Negative.  Negative for weakness and numbness.    Physical Exam Updated Vital Signs BP 131/77   Pulse 72   Temp 98.5 F (36.9 C)   Resp 16   Ht 5\' 1"  (1.549 m)   Wt 59 kg   LMP  (LMP Unknown)   SpO2 100%   BMI 24.56 kg/m   Physical Exam Constitutional:      General: She is not in acute distress.    Appearance: Normal appearance. She is well-developed. She is not ill-appearing or diaphoretic.  HENT:     Head: Normocephalic and atraumatic.  Eyes:     General: Vision grossly intact. Gaze aligned appropriately.     Pupils: Pupils are equal, round, and reactive to light.  Neck:     Trachea: Trachea and phonation normal.  Pulmonary:     Effort: Pulmonary effort is normal. No respiratory distress.  Abdominal:     General: There is no distension.     Palpations: Abdomen is soft.     Tenderness: There is no abdominal tenderness. There is no guarding or rebound.  Musculoskeletal:        General: Normal range of motion.     Cervical back: Normal range of motion.     Comments: Bialteral hands: No gross deformities, skin intact. Fingers appear normal.  Symmetric in appearance.  Mild tenderness to palpation over bilateral index MCPs without skin change or evidence of injury. No snuffbox tenderness to palpation. No tenderness to palpation over flexor sheath.  Finger adduction/abduction intact with 5/5 strength.  Thumb opposition intact. Full active and resisted ROM to flexion/extension at wrist, MCP, PIP and DIP of all fingers.  FDS/FDP intact. Grip 5/5 strength.  Radial artery 2+ with <2sec cap refill in all fingers.  Sensation intact to light-tough in median/ulnar/radial distributions.  Compartments soft. - Good range of motion of the wrist elbow shoulders and neck without pain.   Skin:    General: Skin is warm and dry.  Neurological:     Mental Status: She is alert.     GCS: GCS  eye subscore is 4. GCS verbal subscore is 5. GCS motor subscore is 6.     Comments: Speech is clear and goal oriented, follows commands Major Cranial nerves without deficit, no facial droop Moves extremities without ataxia, coordination intact  Psychiatric:        Behavior: Behavior normal.     ED Results / Procedures / Treatments   Labs (all labs ordered are listed, but only abnormal results are displayed) Labs Reviewed - No data to display  EKG None  Radiology DG Hand Complete Left  Result Date: 07/05/2020 CLINICAL DATA:  Pain EXAM: LEFT HAND - COMPLETE 3+ VIEW COMPARISON:  None. FINDINGS: Frontal, oblique, and lateral views were obtained. There is no fracture or dislocation. Joint spaces appear normal. No erosive change or periostitis. Bony mineralization normal. IMPRESSION: No fracture or dislocation.  No evident arthropathy. Electronically Signed   By: Bretta Bang III M.D.   On: 07/05/2020 12:46   DG Hand Complete Right  Result Date: 07/05/2020 CLINICAL DATA:  BILATERAL hand pain over the past 4-5 months, RIGHT greater than LEFT, which worsens when the patient does her work as a Advertising copywriter. EXAM: RIGHT HAND - COMPLETE 3+ VIEW COMPARISON:  None. FINDINGS: No evidence of acute or subacute fracture or dislocation. Joint spaces well preserved. Well-preserved bone mineral density. No intrinsic osseous abnormalities. IMPRESSION: Normal examination. Electronically Signed   By: Hulan Saas M.D.   On: 07/05/2020 12:47    Procedures Procedures (including critical care time)  Medications Ordered in ED Medications  acetaminophen (TYLENOL) tablet 650 mg (650 mg Oral Given 07/05/20 1233)    ED Course  I have reviewed the triage vital signs and the nursing notes.  Pertinent labs & imaging results that were available during my care of the patient were reviewed by me and considered in my medical decision making (see chart for details).    MDM Rules/Calculators/A&P                          Additional history obtained from: 1. Nursing notes from this visit. 2. Review of electronic medical records, no pertinent recent ER visits. 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  Pain is around the index MCP joints bilaterally there is no evidence of injury or overlying skin change.  She has good range of motion strength of all joints strong equal radial pulses.  Neurovascular intact.  No evidence of cellulitis, septic arthritis, DVT, compartment syndrome, ischemia or other emergent pathologies, additionally does not appear gouty.  Will obtain x-rays to evaluate for possible arthritis low suspicion for fracture/dislocation.  Patient is agreeable for plain films.  DG Right Hand:    IMPRESSION:  Normal examination.   DG Left Hand:  IMPRESSION:  No fracture or dislocation. No evident arthropathy.   Patient informed of findings above and she states understanding she is requesting a work note which will be given.  Suspect patient may have some muscular soreness or tendinopathy from her work but there is no evidence of infection or other emergent pathologies.  Will refer to orthopedist for further evaluation discussed rice therapy and OTC anti-inflammatories. - Additionally patient reports intermittently that her bellybutton will appear swollen when she eats, this resolves spontaneously and is not associated with pain nausea vomiting fever or any additional concerns.  Patient's abdomen is soft nontender and without peritoneal signs, she has no evidence of incarcerated periumbilical hernia or other emergent pathologies of the abdomen.  Patient encouraged to follow-up with her PCP and to return to the ER for any new or worsening symptoms.  At this time there does not appear to be any evidence of an acute emergency medical condition  and the patient appears stable for discharge with appropriate outpatient follow up. Diagnosis was discussed with patient who verbalizes understanding of care plan and is agreeable to discharge. I have discussed return precautions with patient who verbalizes understanding. Patient encouraged to follow-up with their PCP  and Ortho. All questions answered.  Patient seen by Dr. Stevie Kern during this visit who agrees with plan of care for discharge with outpatient follow-up.  Note: Portions of this report may have been transcribed using voice recognition software. Every effort was made to ensure accuracy; however, inadvertent computerized transcription errors may still be present. Final Clinical Impression(s) / ED Diagnoses Final diagnoses:  Pain in both hands    Rx / DC Orders ED Discharge Orders    None       Bill Salinas, PA-C 07/05/20 1335    Elizabeth Palau 07/05/20 1336    Milagros Loll, MD 07/07/20 1351

## 2020-07-05 NOTE — ED Triage Notes (Signed)
Patient reports bilateral hand pain over the past 4-5 months. Patient reports an intermittent sharp, tingling pain that radiates up the arm. 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

## 2020-07-05 NOTE — Discharge Instructions (Signed)
At this time there does not appear to be the presence of an emergent medical condition, however there is always the potential for conditions to change. Please read and follow the below instructions.  Please return to the Emergency Department immediately for any new or worsening symptoms. Please be sure to follow up with your Primary Care Provider within one week regarding your visit today; please call their office to schedule an appointment even if you are feeling better for a follow-up visit. You may call Dr. Arita Miss under discharge paperwork for follow-up appointment regarding your bilateral hand pain.  Rest ice elevation and over-the-counter anti-inflammatory such as Tylenol as directed on the packaging may be helpful for your symptoms. Be sure to discuss your intermittent bellybutton swelling with your primary care provider at your follow-up visit.  Return to the emergency department immediately if you have any abdominal pain nausea vomiting redness swelling or additional concerns.  Go to the nearest Emergency Department immediately if: You have fever or chills Your hand becomes warm, red, or swollen. Your hand is numb or tingling. Your hand is extremely swollen or deformed. Your hand or fingers turn white or blue. You cannot move your hand, wrist, or fingers. You have abdominal pain You have nausea or vomiting You have redness or swelling to your abdomen You have any new/concerning or worsening of symptoms  Please read the additional information packets attached to your discharge summary.  Do not take your medicine if  develop an itchy rash, swelling in your mouth or lips, or difficulty breathing; call 911 and seek immediate emergency medical attention if this occurs.  You may review your lab tests and imaging results in their entirety on your MyChart account.  Please discuss all results of fully with your primary care provider and other specialist at your follow-up visit.  Note: Portions  of this text may have been transcribed using voice recognition software. Every effort was made to ensure accuracy; however, inadvertent computerized transcription errors may still be present.

## 2021-01-30 ENCOUNTER — Other Ambulatory Visit: Payer: Self-pay

## 2021-01-30 ENCOUNTER — Emergency Department (HOSPITAL_COMMUNITY)
Admission: EM | Admit: 2021-01-30 | Discharge: 2021-01-30 | Disposition: A | Discharge status: Home / Self Care | Payer: Medicaid Other | Attending: Emergency Medicine | Admitting: Emergency Medicine

## 2021-01-30 ENCOUNTER — Encounter (HOSPITAL_COMMUNITY): Payer: Self-pay

## 2021-01-30 DIAGNOSIS — F17210 Nicotine dependence, cigarettes, uncomplicated: Secondary | ICD-10-CM | POA: Insufficient documentation

## 2021-01-30 DIAGNOSIS — L299 Pruritus, unspecified: Secondary | ICD-10-CM

## 2021-01-30 LAB — CBC WITH DIFFERENTIAL/PLATELET
Abs Immature Granulocytes: 0.01 10*3/uL (ref 0.00–0.07)
Basophils Absolute: 0 10*3/uL (ref 0.0–0.1)
Basophils Relative: 1 %
Eosinophils Absolute: 0.1 10*3/uL (ref 0.0–0.5)
Eosinophils Relative: 2 %
HCT: 38.6 % (ref 36.0–46.0)
Hemoglobin: 12.4 g/dL (ref 12.0–15.0)
Immature Granulocytes: 0 %
Lymphocytes Relative: 67 %
Lymphs Abs: 4 10*3/uL (ref 0.7–4.0)
MCH: 24.2 pg — ABNORMAL LOW (ref 26.0–34.0)
MCHC: 32.1 g/dL (ref 30.0–36.0)
MCV: 75.4 fL — ABNORMAL LOW (ref 80.0–100.0)
Monocytes Absolute: 0.5 10*3/uL (ref 0.1–1.0)
Monocytes Relative: 9 %
Neutro Abs: 1.2 10*3/uL — ABNORMAL LOW (ref 1.7–7.7)
Neutrophils Relative %: 21 %
Platelets: 279 10*3/uL (ref 150–400)
RBC: 5.12 MIL/uL — ABNORMAL HIGH (ref 3.87–5.11)
RDW: 16.3 % — ABNORMAL HIGH (ref 11.5–15.5)
WBC: 5.9 10*3/uL (ref 4.0–10.5)
nRBC: 0 % (ref 0.0–0.2)

## 2021-01-30 LAB — COMPREHENSIVE METABOLIC PANEL
ALT: 16 U/L (ref 0–44)
AST: 17 U/L (ref 15–41)
Albumin: 5 g/dL (ref 3.5–5.0)
Alkaline Phosphatase: 43 U/L (ref 38–126)
Anion gap: 8 (ref 5–15)
BUN: 16 mg/dL (ref 6–20)
CO2: 25 mmol/L (ref 22–32)
Calcium: 9.4 mg/dL (ref 8.9–10.3)
Chloride: 104 mmol/L (ref 98–111)
Creatinine, Ser: 0.72 mg/dL (ref 0.44–1.00)
GFR, Estimated: >60 mL/min (ref >60)
Glucose, Bld: 82 mg/dL (ref 70–99)
Potassium: 3.8 mmol/L (ref 3.5–5.1)
Sodium: 137 mmol/L (ref 135–145)
Total Bilirubin: 0.6 mg/dL (ref 0.3–1.2)
Total Protein: 8.4 g/dL — ABNORMAL HIGH (ref 6.5–8.1)

## 2021-01-30 LAB — RAPID URINE DRUG SCREEN, HOSP PERFORMED
Amphetamines: POSITIVE — AB
Barbiturates: NOT DETECTED
Benzodiazepines: NOT DETECTED
Cocaine: NOT DETECTED
Opiates: NOT DETECTED
Tetrahydrocannabinol: POSITIVE — AB

## 2021-01-30 LAB — ETHANOL: Alcohol, Ethyl (B): <10 mg/dL (ref <10)

## 2021-01-30 LAB — I-STAT BETA HCG BLOOD, ED (MC, WL, AP ONLY): I-stat hCG, quantitative: <5 m[IU]/mL (ref <5)

## 2021-01-30 MED ORDER — PERMETHRIN 5 % EX CREA: TOPICAL_CREAM | CUTANEOUS | 0 refills | Status: DC

## 2021-01-30 MED ORDER — DIPHENHYDRAMINE HCL 25 MG PO CAPS
25.0000 mg | ORAL_CAPSULE | Freq: Once | ORAL | Status: AC
  Administered 2021-01-30: 03:00:00 25 mg via ORAL
  Filled 2021-01-30: qty 1

## 2021-01-30 NOTE — ED Provider Notes (Signed)
MSE was initiated and I personally evaluated the patient and placed orders (if any) at  12:34 AM on January 30, 2021.  Compalins of generalized itching, rash around breast. Seeing things that aren't there, history of same. No SI/HI.  Today's Vitals   01/30/21 0013 01/30/21 0015  BP: 122/79   Pulse: 60   Resp: 20   Temp: 98.2 F (36.8 C)   TempSrc: Oral   SpO2: 100%   Weight:  57 kg  Height:  5' 0.5" (1.537 m)  PainSc:  0-No pain   Body mass index is 24.14 kg/m.  No rash Speech pressured, rapid Endorses visual hallucinations  The patient appears stable so that the remainder of the MSE may be completed by another provider.   Elpidio Anis, PA-C 01/30/21 0035    Paula Libra, MD 01/30/21 0630

## 2021-01-30 NOTE — ED Triage Notes (Addendum)
Pt BIB [facility] with multiple complaints.   1) Pt states on 6/3 she started having bilateral breast tenderness and the skin is sloughing off.   2) Pt states on 6/6 she started having pruritis all over after jumping in a dumpster a week earlier.   3) Pt states last PM she thought she had an insect bite to R arm. Pt reports swelling and itching to the R arm.  4) Pt states this evening she was hallucinating that it was raining, but her co-workers denied what she was seeing.   Pt has not taken mental health medication for 2 years.

## 2021-01-30 NOTE — ED Provider Notes (Signed)
[name] Provider Note   CSN: [id number] Arrival date & time: 01/30/21  0003     History No chief complaint on file.   Amy Salinas is a 35 y.o. female.  35 year old female with prior medical history as detailed below presents for evaluation.  Patient complains of itching and possible bug bites all over her body.  Symptoms been going on for several days.  She is concerned about possible scabies.  She denies other close family members or friends with similar symptoms.  She does report that she has been sleeping in a communal bed at work.  She denies fever.  She denies SI or HI.  She is calm and cooperative during the evaluation.  The history is provided by the patient and medical records.  Illness Location:  Diffuse itching, concern over possible bug bites Severity:  Mild Onset quality:  Gradual Duration:  1 week Timing:  Constant Progression:  Waxing and waning Chronicity:  New      Past Medical History:  Diagnosis Date  . Bipolar 1 disorder (HCC)   . GERD (gastroesophageal reflux disease)   . PTSD (post-traumatic stress disorder)   . Schizoaffective disorder, bipolar type (HCC)     There are no problems to display for this patient.   Past Surgical History:  Procedure Laterality Date  . biopsy of stomach    . INDUCED ABORTION  2009     OB History   No obstetric history on file.     Family History  Problem Relation Age of Onset  . Breast cancer Mother   . Hypertension Father   . Diabetes Father   . Prostate cancer Father   . Colon cancer Neg Hx     Social History   Tobacco Use  . Smoking status: Current Every Day Smoker    Packs/day: 0.25    Types: Cigarettes  . Smokeless tobacco: Never Used  Vaping Use  . Vaping Use: Some days  Substance Use Topics  . Alcohol use: Not Currently    Comment: occ  . Drug use: Not Currently    Types: Marijuana    Home Medications Prior to Admission medications    Medication Sig Start Date End Date Taking? Authorizing Provider  permethrin (ELIMITE) 5 % cream Apply to affected area once 01/30/21  Yes Trampus Mcquerry, Noralyn Pick, MD  acetaminophen-codeine (TYLENOL #3) 300-30 MG tablet Take 1 tablet by mouth every 6 (six) hours as needed for moderate pain. 09/08/16   Lorre Nick, MD  alum & mag hydroxide-simeth (MAALOX MAX) 400-400-40 MG/5ML suspension Take 10 mLs by mouth every 6 (six) hours as needed for indigestion. Patient not taking: Reported on 12/01/2019 08/23/18   [name], [name], PA-C  cetirizine (ZYRTEC) 10 MG tablet Take 1 tablet (10 mg total) by mouth daily. At bedtime to help with runny nose 12/01/19   Anders Simmonds, PA-C  cyclobenzaprine (FLEXERIL) 10 MG tablet Take 10 mg by mouth 3 (three) times daily as needed for muscle spasms.    [provider]  dicyclomine (BENTYL) 20 MG tablet Take 1 tablet (20 mg total) by mouth 4 (four) times daily -  before meals and at bedtime. As needed for abdominal pain Patient not taking: Reported on 12/01/2019 02/21/19   [name], Hewitt Shorts, MD  divalproex (DEPAKOTE) 500 MG DR tablet Take 500 mg by mouth 3 (three) times daily.    [provider]  famotidine (PEPCID) 20 MG tablet Take 1 tablet (20 mg total) by mouth 2 (  two) times daily. 12/01/19   Anders Simmonds, PA-C  Lurasidone HCl (LATUDA) 120 MG TABS Take 120 mg by mouth daily.     [provider]  mirtazapine (REMERON) 30 MG tablet Take 30 mg by mouth at bedtime.    [provider]  naproxen (NAPROSYN) 500 MG tablet Take 1 tablet (500 mg total) by mouth 2 (two) times daily. Patient not taking: Reported on 12/01/2019 10/02/19   Arlyn Dunning, PA-C  pantoprazole (PROTONIX) 40 MG tablet Take 1 tablet (40 mg total) by mouth daily. To decrease stomach acid Patient not taking: Reported on 12/01/2019 02/21/19   [name], MD  traZODone (DESYREL) 50 MG tablet Take 0.5-1 tablets (25-50 mg total) by mouth at bedtime as needed for sleep. Patient not  taking: Reported on 12/01/2019 02/21/19   [name], MD    Allergies    Other  Review of Systems   Review of Systems  All other systems reviewed and are negative.   Physical Exam Updated Vital Signs BP (!) 136/99 (BP Location: Right Arm)   Pulse 62   Temp 98.2 F (36.8 C) (Oral)   Resp 16   Ht 5' 0.5" (1.537 m)   Wt 57 kg   LMP 01/16/2021   SpO2 100%   BMI 24.14 kg/m   Physical Exam Vitals and nursing note reviewed.  Constitutional:      General: She is not in acute distress.    Appearance: She is well-developed.  HENT:     Head: Normocephalic and atraumatic.  Eyes:     Conjunctiva/sclera: Conjunctivae normal.     Pupils: Pupils are equal, round, and reactive to light.  Cardiovascular:     Rate and Rhythm: Normal rate and regular rhythm.     Heart sounds: Normal heart sounds.  Pulmonary:     Effort: Pulmonary effort is normal. No respiratory distress.     Breath sounds: Normal breath sounds.  Abdominal:     General: There is no distension.     Palpations: Abdomen is soft.     Tenderness: There is no abdominal tenderness.  Musculoskeletal:        General: No deformity. Normal range of motion.     Cervical back: Normal range of motion and neck supple.  Skin:    General: Skin is warm and dry.     Comments: No discrete evidence on exam of significant bug bites or other rash.  Neurological:     General: No focal deficit present.     Mental Status: She is alert and oriented to person, place, and time.  Psychiatric:     Comments: Denies SI or HI     ED Results / Procedures / Treatments   Labs (all labs ordered are listed, but only abnormal results are displayed) Labs Reviewed  CBC WITH DIFFERENTIAL/PLATELET - Abnormal; Notable for the following components:      Result Value   RBC 5.12 (*)    MCV 75.4 (*)    MCH 24.2 (*)    RDW 16.3 (*)    Neutro Abs 1.2 (*)    All other components within normal limits  COMPREHENSIVE METABOLIC PANEL - Abnormal; Notable  for the following components:   Total Protein 8.4 (*)    All other components within normal limits  RAPID URINE DRUG SCREEN, HOSP PERFORMED - Abnormal; Notable for the following components:   Amphetamines POSITIVE (*)    Tetrahydrocannabinol POSITIVE (*)    All other components within normal limits  ETHANOL  I-STAT BETA HCG BLOOD, ED ([facility], [facility], [facility] ONLY)    EKG None  Radiology No results found.  Procedures Procedures   Medications Ordered in ED Medications  diphenhydrAMINE (BENADRYL) capsule 25 mg (25 mg Oral Given 01/30/21 0241)    ED Course  I have reviewed the triage vital signs and the nursing notes.  Pertinent labs & imaging results that were available during my care of the patient were reviewed by me and considered in my medical decision making (see chart for details).    MDM Rules/Calculators/A&P                          MDM  MSE complete  Amy Salinas was evaluated in Emergency Department on 01/30/2021 for the symptoms described in the history of present illness. She was evaluated in the context of the global COVID-19 pandemic, which necessitated consideration that the patient might be at risk for infection with the SARS-CoV-2 virus that causes COVID-19. Institutional protocols and algorithms that pertain to the evaluation of patients at risk for COVID-19 are in a state of rapid change based on information released by regulatory bodies including the CDC and federal and state organizations. These policies and algorithms were followed during the patient's care in the ED.  Patient is concerned regarding possible bug bites.  She request treatment for possible scabies.  Exam is not consistent with likely scabies.  Patient has apparently been sleeping in a communal sleeping area at work.   She was educated regarding possibility of bed bugs.  She is without indication for acute psychiatric evaluation/psych hold.  Importance of close follow-up is stressed.  Strict  return precautions given and understood.  Final Clinical Impression(s) / ED Diagnoses Final diagnoses:  Pruritus    Rx / DC Orders ED Discharge Orders         Ordered    permethrin (ELIMITE) 5 % cream        01/30/21 0707           Wynetta Fines, MD 01/30/21 [phone]

## 2021-01-30 NOTE — ED Notes (Signed)
Pt now stating there are bugs that have crawled from her vagina and are at her nipples. Pt states she is itching all over.

## 2021-01-30 NOTE — Discharge Instructions (Signed)
Return for any problem.  ?

## 2021-07-14 ENCOUNTER — Emergency Department (HOSPITAL_COMMUNITY)
Admission: EM | Admit: 2021-07-14 | Discharge: 2021-07-14 | Disposition: A | Discharge status: Home / Self Care | Payer: 59 | Attending: Emergency Medicine | Admitting: Emergency Medicine

## 2021-07-14 ENCOUNTER — Encounter (HOSPITAL_COMMUNITY): Payer: Self-pay

## 2021-07-14 DIAGNOSIS — F17210 Nicotine dependence, cigarettes, uncomplicated: Secondary | ICD-10-CM | POA: Insufficient documentation

## 2021-07-14 DIAGNOSIS — M7711 Lateral epicondylitis, right elbow: Secondary | ICD-10-CM | POA: Insufficient documentation

## 2021-07-14 DIAGNOSIS — M545 Low back pain, unspecified: Secondary | ICD-10-CM | POA: Insufficient documentation

## 2021-07-14 MED ORDER — CYCLOBENZAPRINE HCL 10 MG PO TABS: 10.0000 mg | ORAL_TABLET | Freq: Two times a day (BID) | ORAL | 0 refills | Status: DC | PRN

## 2021-07-14 NOTE — Discharge Instructions (Signed)
You were seen in the emergency department for elbow pain and back pain.  As we discussed I think your elbow pain is related to a tendinitis, which is often due to overuse.  I also think that your back pain is related to a muscle strain, likely from work.  We normally treat these with over-the-counter medications like ibuprofen or Tylenol, and I am also prescribing you a muscle relaxer.  The muscle relaxer can often make you sleepy, so like you to take it before going to sleep until you know how it makes you feel.  Continue to monitor how you're doing and return to the ER for new or worsening symptoms such as numbness, tingling, inability to urinate, or incontinence of your bowels or bladder.   It has been a pleasure seeing and caring for you today and I hope you start feeling better soon!

## 2021-07-14 NOTE — ED Provider Notes (Signed)
[facility]-EMERGENCY DEPT Provider Note   CSN: [id number] Arrival date & time: 07/14/21  1443     History Chief Complaint  Patient presents with   Back Pain    Amy Salinas is a 35 y.o. female with history of schizoaffective disorder and bipolar disorder who presents to the emergency department for 2 days of right elbow and lower back pain.  Patient states that she noticed symptoms when she woke up 2 days ago.  There is no inciting injury or trauma.  Notes that her right elbow pain is made worse with bending her elbow, and intermittently causes her hand to shake.  She took Tylenol this morning with minimal relief.  She states that her back pain is made worse with prolonged movement, or prolonged rest, as well as working outside in the cold.  No fevers, chills, urinary symptoms, numbness, tingling, saddle anesthesia, urinary retention or bowel/bladder incontinence.   Back Pain Associated symptoms: no abdominal pain, no chest pain, no dysuria and no fever       Past Medical History:  Diagnosis Date   Bipolar 1 disorder (HCC)    GERD (gastroesophageal reflux disease)    PTSD (post-traumatic stress disorder)    Schizoaffective disorder, bipolar type (HCC)     There are no problems to display for this patient.   Past Surgical History:  Procedure Laterality Date   biopsy of stomach     INDUCED ABORTION  2009     OB History   No obstetric history on file.     Family History  Problem Relation Age of Onset   Breast cancer Mother    Hypertension Father    Diabetes Father    Prostate cancer Father    Colon cancer Neg Hx     Social History   Tobacco Use   Smoking status: Every Day    Packs/day: 0.25    Types: Cigarettes   Smokeless tobacco: Never  Vaping Use   Vaping Use: Some days  Substance Use Topics   Alcohol use: Not Currently    Comment: occ   Drug use: Not Currently    Types: Marijuana    Home Medications Prior to Admission  medications   Medication Sig Start Date End Date Taking? Authorizing Provider  cyclobenzaprine (FLEXERIL) 10 MG tablet Take 1 tablet (10 mg total) by mouth 2 (two) times daily as needed for muscle spasms. 07/14/21  Yes Allisen Pidgeon T, PA-C  acetaminophen-codeine (TYLENOL #3) 300-30 MG tablet Take 1 tablet by mouth every 6 (six) hours as needed for moderate pain. 09/08/16   Lorre Nick, MD  alum & mag hydroxide-simeth (MAALOX MAX) 400-400-40 MG/5ML suspension Take 10 mLs by mouth every 6 (six) hours as needed for indigestion. Patient not taking: Reported on 12/01/2019 08/23/18   [name], [name], PA-C  cetirizine (ZYRTEC) 10 MG tablet Take 1 tablet (10 mg total) by mouth daily. At bedtime to help with runny nose 12/01/19   Anders Simmonds, PA-C  dicyclomine (BENTYL) 20 MG tablet Take 1 tablet (20 mg total) by mouth 4 (four) times daily -  before meals and at bedtime. As needed for abdominal pain Patient not taking: Reported on 12/01/2019 02/21/19   [name], Hewitt Shorts, MD  divalproex (DEPAKOTE) 500 MG DR tablet Take 500 mg by mouth 3 (three) times daily.    [provider]  famotidine (PEPCID) 20 MG tablet Take 1 tablet (20 mg total) by mouth 2 (two) times daily. 12/01/19   Anders Simmonds,  PA-C  Lurasidone HCl (LATUDA) 120 MG TABS Take 120 mg by mouth daily.     [provider]  mirtazapine (REMERON) 30 MG tablet Take 30 mg by mouth at bedtime.    [provider]  naproxen (NAPROSYN) 500 MG tablet Take 1 tablet (500 mg total) by mouth 2 (two) times daily. Patient not taking: Reported on 12/01/2019 10/02/19   [name], PA-C  pantoprazole (PROTONIX) 40 MG tablet Take 1 tablet (40 mg total) by mouth daily. To decrease stomach acid Patient not taking: Reported on 12/01/2019 02/21/19   Antony Blackbird, MD  permethrin (ELIMITE) 5 % cream Apply to affected area once 01/30/21   Valarie Merino, MD  traZODone (DESYREL) 50 MG tablet Take 0.5-1 tablets (25-50 mg total) by mouth at bedtime as  needed for sleep. Patient not taking: Reported on 12/01/2019 02/21/19   Antony Blackbird, MD    Allergies    Other  Review of Systems   Review of Systems  Constitutional:  Negative for chills and fever.  Respiratory:  Negative for shortness of breath.   Cardiovascular:  Negative for chest pain.  Gastrointestinal:  Negative for abdominal pain, constipation, diarrhea, nausea and vomiting.  Genitourinary:  Negative for dysuria, frequency, hematuria and urgency.  Musculoskeletal:  Positive for back pain. Negative for gait problem and neck pain.       Right elbow pain   Physical Exam Updated Vital Signs BP 124/87 (BP Location: Left Arm)   Pulse 81   Temp 98.6 F (37 C) (Oral)   Resp 17   SpO2 100%   Physical Exam Vitals and nursing note reviewed.  Constitutional:      Appearance: Normal appearance.  HENT:     Head: Normocephalic and atraumatic.  Eyes:     Conjunctiva/sclera: Conjunctivae normal.  Pulmonary:     Effort: Pulmonary effort is normal. No respiratory distress.  Abdominal:     General: Abdomen is flat. There is no distension.     Palpations: Abdomen is soft.     Tenderness: There is no abdominal tenderness. There is no right CVA tenderness, left CVA tenderness or guarding.  Musculoskeletal:     Comments: No midline spinal tenderness, step-offs or crepitus.  Strength 5/5, pulses equal and normal, and sensation intact in all extremities.  There is tenderness to palpation of the lateral epicondyle of the right elbow.  No increased warmth or any skin changes.  Skin:    General: Skin is warm and dry.     Comments: No skin changes on the back or right elbow.  Neurological:     Mental Status: She is alert.  Psychiatric:        Mood and Affect: Mood normal.        Behavior: Behavior normal.    ED Results / Procedures / Treatments   Labs (all labs ordered are listed, but only abnormal results are displayed) Labs Reviewed - No data to display  EKG None  Radiology No  results found.  Procedures Procedures   Medications Ordered in ED Medications - No data to display  ED Course  I have reviewed the triage vital signs and the nursing notes.  Pertinent labs & imaging results that were available during my care of the patient were reviewed by me and considered in my medical decision making (see chart for details).    MDM Rules/Calculators/A&P  Patient is otherwise healthy 35 year old female presents to the emergency department for lower back pain and right elbow pain x2 days.  No inciting trauma.  No fevers, chills, chest pain, shortness of breath, numbness, tingling, saddle anesthesia, urinary retention or incontinence of bowel or bladder to suggest cauda equina.  No urinary symptoms. No history of IV drug use.  Exam patient has no midline spinal tenderness, step-offs or crepitus.  She has full strength, normal pulses, and sensation is intact in all extremities.  There is mild tenderness to palpation over the lateral epicondyle of the right elbow, with no increased warmth or skin changes.  I have low concern for septic joint.  Symptoms most likely related to lumbar muscular strain and tendinitis of the right elbow, will treat symptomatically with over-the-counter medications and muscle relaxer.  Will splint right elbow for comfort.  I do not believe patient is requiring admission or inpatient treatment for symptoms at this time.  Discussed reasons to return to the emergency department.  Patient agreeable to plan.  Final Clinical Impression(s) / ED Diagnoses Final diagnoses:  Acute midline low back pain without sciatica  Epicondylitis, lateral, right    Rx / DC Orders ED Discharge Orders          Ordered    cyclobenzaprine (FLEXERIL) 10 MG tablet  2 times daily PRN        07/14/21 1707             Everlean Bucher T, PA-C 07/14/21 1711    Carmin Muskrat, MD 07/14/21 2056

## 2021-07-14 NOTE — ED Provider Notes (Signed)
Emergency Medicine Provider Triage Evaluation Note  TARIKA MCKETHAN , a 35 y.o. female  was evaluated in triage.  Pt complains of bilateral  low back pain.  No urinary symptoms.   She reports pain in the right elbow.  Since two days ago in both the back and right elbow. No fevers.  No abdominal pain.    Review of Systems  Positive: Right lateral elbow pain.   Negative: Fevers.   Physical Exam  BP 124/87 (BP Location: Left Arm)   Pulse 81   Temp 98.6 F (37 C) (Oral)   Resp 17   SpO2 100%  Gen:   Awake, no distress   Resp:  Normal effort  MSK:   Moves extremities without difficulty.  Right elbow pain is present over the radial forearm poximally.  This is pain with wrist movements against resistance and made better when pressure placed proximally on tendon Other:  Normal gait.   Medical Decision Making  Medically screening exam initiated at 3:24 PM.  Appropriate orders placed.  KEYERRA LAMERE was informed that the remainder of the evaluation will be completed by another provider, this initial triage assessment does not replace that evaluation, and the importance of remaining in the ED until their evaluation is complete.  Suspect that patient may have epicondylitis of the elbow.  No trauma or injury that would indicate need for x-rays to evaluate for fracture. Regarding her back pain she is ambulatory.  No red flag symptoms.  Note: Portions of this report may have been transcribed using voice recognition software. Every effort was made to ensure accuracy; however, inadvertent computerized transcription errors may be present    Norman Clay 07/14/21 1531    Gerhard Munch, MD 07/14/21 2055

## 2021-07-14 NOTE — ED Triage Notes (Addendum)
Pt c/o lower back and right elbow pain x 2 days.

## 2021-12-26 ENCOUNTER — Other Ambulatory Visit: Payer: Self-pay

## 2021-12-26 ENCOUNTER — Emergency Department (HOSPITAL_COMMUNITY)
Admission: EM | Admit: 2021-12-26 | Discharge: 2021-12-26 | Disposition: A | Discharge status: Home / Self Care | Payer: 59 | Attending: Emergency Medicine | Admitting: Emergency Medicine

## 2021-12-26 ENCOUNTER — Emergency Department (HOSPITAL_COMMUNITY): Payer: 59

## 2021-12-26 ENCOUNTER — Encounter (HOSPITAL_COMMUNITY): Payer: Self-pay | Admitting: Emergency Medicine

## 2021-12-26 DIAGNOSIS — R188 Other ascites: Secondary | ICD-10-CM | POA: Diagnosis not present

## 2021-12-26 DIAGNOSIS — N83202 Unspecified ovarian cyst, left side: Secondary | ICD-10-CM | POA: Diagnosis not present

## 2021-12-26 DIAGNOSIS — R109 Unspecified abdominal pain: Secondary | ICD-10-CM

## 2021-12-26 DIAGNOSIS — F17210 Nicotine dependence, cigarettes, uncomplicated: Secondary | ICD-10-CM | POA: Diagnosis not present

## 2021-12-26 DIAGNOSIS — R1084 Generalized abdominal pain: Secondary | ICD-10-CM | POA: Diagnosis not present

## 2021-12-26 DIAGNOSIS — Z743 Need for continuous supervision: Secondary | ICD-10-CM | POA: Diagnosis not present

## 2021-12-26 DIAGNOSIS — R69 Illness, unspecified: Secondary | ICD-10-CM | POA: Diagnosis not present

## 2021-12-26 DIAGNOSIS — Z202 Contact with and (suspected) exposure to infections with a predominantly sexual mode of transmission: Secondary | ICD-10-CM | POA: Diagnosis not present

## 2021-12-26 DIAGNOSIS — N9489 Other specified conditions associated with female genital organs and menstrual cycle: Secondary | ICD-10-CM | POA: Insufficient documentation

## 2021-12-26 DIAGNOSIS — B9689 Other specified bacterial agents as the cause of diseases classified elsewhere: Secondary | ICD-10-CM | POA: Diagnosis not present

## 2021-12-26 DIAGNOSIS — N7093 Salpingitis and oophoritis, unspecified: Secondary | ICD-10-CM | POA: Diagnosis not present

## 2021-12-26 DIAGNOSIS — R300 Dysuria: Secondary | ICD-10-CM | POA: Diagnosis not present

## 2021-12-26 DIAGNOSIS — N760 Acute vaginitis: Secondary | ICD-10-CM | POA: Diagnosis not present

## 2021-12-26 LAB — COMPREHENSIVE METABOLIC PANEL
ALT: 11 U/L (ref 0–44)
AST: 23 U/L (ref 15–41)
Albumin: 3.2 g/dL — ABNORMAL LOW (ref 3.5–5.0)
Alkaline Phosphatase: 63 U/L (ref 38–126)
Anion gap: 5 (ref 5–15)
BUN: 6 mg/dL (ref 6–20)
CO2: 24 mmol/L (ref 22–32)
Calcium: 8.7 mg/dL — ABNORMAL LOW (ref 8.9–10.3)
Chloride: 105 mmol/L (ref 98–111)
Creatinine, Ser: 0.58 mg/dL (ref 0.44–1.00)
GFR, Estimated: >60 mL/min (ref >60)
Glucose, Bld: 130 mg/dL — ABNORMAL HIGH (ref 70–99)
Potassium: 4.4 mmol/L (ref 3.5–5.1)
Sodium: 134 mmol/L — ABNORMAL LOW (ref 135–145)
Total Bilirubin: 1 mg/dL (ref 0.3–1.2)
Total Protein: 8.2 g/dL — ABNORMAL HIGH (ref 6.5–8.1)

## 2021-12-26 LAB — CBC
HCT: 31.6 % — ABNORMAL LOW (ref 36.0–46.0)
Hemoglobin: 10.2 g/dL — ABNORMAL LOW (ref 12.0–15.0)
MCH: 22.9 pg — ABNORMAL LOW (ref 26.0–34.0)
MCHC: 32.3 g/dL (ref 30.0–36.0)
MCV: 70.9 fL — ABNORMAL LOW (ref 80.0–100.0)
Platelets: 338 10*3/uL (ref 150–400)
RBC: 4.46 MIL/uL (ref 3.87–5.11)
RDW: 14.7 % (ref 11.5–15.5)
WBC: 10.9 10*3/uL — ABNORMAL HIGH (ref 4.0–10.5)
nRBC: 0 % (ref 0.0–0.2)

## 2021-12-26 LAB — URINALYSIS, ROUTINE W REFLEX MICROSCOPIC
Bacteria, UA: NONE SEEN
Bilirubin Urine: NEGATIVE
Glucose, UA: NEGATIVE mg/dL
Ketones, ur: NEGATIVE mg/dL
Nitrite: NEGATIVE
Protein, ur: NEGATIVE mg/dL
Specific Gravity, Urine: 1.008 (ref 1.005–1.030)
pH: 7 (ref 5.0–8.0)

## 2021-12-26 LAB — I-STAT BETA HCG BLOOD, ED (MC, WL, AP ONLY): I-stat hCG, quantitative: <5 m[IU]/mL (ref <5)

## 2021-12-26 LAB — RAPID HIV SCREEN (HIV 1/2 AB+AG)
HIV 1/2 Antibodies: NONREACTIVE
HIV-1 P24 Antigen - HIV24: NONREACTIVE

## 2021-12-26 LAB — WET PREP, GENITAL
Sperm: NONE SEEN
Trich, Wet Prep: NONE SEEN
WBC, Wet Prep HPF POC: <10 (ref <10)
Yeast Wet Prep HPF POC: NONE SEEN

## 2021-12-26 LAB — LIPASE, BLOOD: Lipase: 26 U/L (ref 11–51)

## 2021-12-26 MED ORDER — HYDROCODONE-ACETAMINOPHEN 5-325 MG PO TABS
1.0000 | ORAL_TABLET | Freq: Once | ORAL | Status: AC
  Administered 2021-12-26: 1 via ORAL
  Filled 2021-12-26: qty 1

## 2021-12-26 MED ORDER — IBUPROFEN 600 MG PO TABS: 600.0000 mg | ORAL_TABLET | Freq: Four times a day (QID) | ORAL | 0 refills | Status: DC | PRN

## 2021-12-26 MED ORDER — ONDANSETRON HCL 4 MG/2ML IJ SOLN
4.0000 mg | Freq: Once | INTRAMUSCULAR | Status: AC
  Administered 2021-12-26: 4 mg via INTRAVENOUS
  Filled 2021-12-26: qty 2

## 2021-12-26 MED ORDER — CEFTRIAXONE SODIUM 1 G IJ SOLR
500.0000 mg | Freq: Once | INTRAMUSCULAR | Status: AC
  Administered 2021-12-26: 500 mg via INTRAMUSCULAR
  Filled 2021-12-26: qty 10

## 2021-12-26 MED ORDER — LIDOCAINE HCL (PF) 1 % IJ SOLN
1.0000 mL | Freq: Once | INTRAMUSCULAR | Status: AC
  Administered 2021-12-26: 1 mL
  Filled 2021-12-26: qty 30

## 2021-12-26 MED ORDER — DOXYCYCLINE HYCLATE 100 MG PO TABS: 100.0000 mg | ORAL_TABLET | Freq: Two times a day (BID) | ORAL | 0 refills | Status: DC

## 2021-12-26 MED ORDER — METRONIDAZOLE 500 MG PO TABS: 500.0000 mg | ORAL_TABLET | Freq: Two times a day (BID) | ORAL | 0 refills | Status: AC

## 2021-12-26 MED ORDER — MORPHINE SULFATE (PF) 4 MG/ML IV SOLN
4.0000 mg | Freq: Once | INTRAVENOUS | Status: AC
  Administered 2021-12-26: 4 mg via INTRAVENOUS
  Filled 2021-12-26: qty 1

## 2021-12-26 MED ORDER — IOHEXOL 300 MG/ML  SOLN
100.0000 mL | Freq: Once | INTRAMUSCULAR | Status: AC | PRN
  Administered 2021-12-26: 100 mL via INTRAVENOUS

## 2021-12-26 NOTE — Discharge Instructions (Addendum)
It was a pleasure caring for you today in the emergency department. ? ?Please return to the emergency department for any worsening or worrisome symptoms. ? ?Please follow up with OBGYN ?

## 2021-12-26 NOTE — ED Notes (Signed)
Patient in restroom, pt aware UA needed.  ?
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
## 2021-12-26 NOTE — ED Notes (Signed)
Unable to collect blood at this time.  

## 2021-12-26 NOTE — ED Notes (Signed)
An After Visit Summary was printed and given to the patient. ?Discharge instructions given and no further questions at this time.  ?Pt given sandwich as requested.  ?

## 2021-12-26 NOTE — ED Triage Notes (Addendum)
BIBA ?Per EMS: Pt coming from home w/ c/o abd pain x1 month. Generalized pain. Denies n/v. Sweaty when using bathroom.  ?VSS  ?148/78 ?96HR ?100% RA ?16RR  ?Pt reports to this RN she has concern for STIs and that when she uses pelvic floor muscles abd cramping is worse.  ? ?

## 2021-12-27 LAB — GC/CHLAMYDIA PROBE AMP (~~LOC~~) NOT AT ARMC
Chlamydia: POSITIVE — AB
Comment: NEGATIVE
Comment: NORMAL
Neisseria Gonorrhea: POSITIVE — AB

## 2022-01-19 ENCOUNTER — Emergency Department (HOSPITAL_COMMUNITY): Payer: 59

## 2022-01-19 ENCOUNTER — Other Ambulatory Visit: Payer: Self-pay

## 2022-01-19 ENCOUNTER — Emergency Department (HOSPITAL_COMMUNITY)
Admission: EM | Admit: 2022-01-19 | Discharge: 2022-01-19 | Disposition: A | Discharge status: Home / Self Care | Payer: 59 | Attending: Emergency Medicine | Admitting: Emergency Medicine

## 2022-01-19 ENCOUNTER — Encounter (HOSPITAL_COMMUNITY): Payer: Self-pay

## 2022-01-19 DIAGNOSIS — M25552 Pain in left hip: Secondary | ICD-10-CM | POA: Insufficient documentation

## 2022-01-19 DIAGNOSIS — R1030 Lower abdominal pain, unspecified: Secondary | ICD-10-CM | POA: Diagnosis not present

## 2022-01-19 DIAGNOSIS — M79604 Pain in right leg: Secondary | ICD-10-CM | POA: Diagnosis not present

## 2022-01-19 DIAGNOSIS — Z1330 Encounter for screening examination for mental health and behavioral disorders, unspecified: Secondary | ICD-10-CM | POA: Insufficient documentation

## 2022-01-19 DIAGNOSIS — F331 Major depressive disorder, recurrent, moderate: Secondary | ICD-10-CM | POA: Diagnosis not present

## 2022-01-19 DIAGNOSIS — M25551 Pain in right hip: Secondary | ICD-10-CM | POA: Diagnosis not present

## 2022-01-19 DIAGNOSIS — R102 Pelvic and perineal pain: Secondary | ICD-10-CM | POA: Diagnosis not present

## 2022-01-19 DIAGNOSIS — Z6911 Encounter for mental health services for victim of spousal or partner abuse: Secondary | ICD-10-CM | POA: Diagnosis not present

## 2022-01-19 DIAGNOSIS — M549 Dorsalgia, unspecified: Secondary | ICD-10-CM | POA: Diagnosis not present

## 2022-01-19 DIAGNOSIS — M542 Cervicalgia: Secondary | ICD-10-CM | POA: Diagnosis not present

## 2022-01-19 DIAGNOSIS — M25561 Pain in right knee: Secondary | ICD-10-CM | POA: Insufficient documentation

## 2022-01-19 DIAGNOSIS — Z79899 Other long term (current) drug therapy: Secondary | ICD-10-CM | POA: Diagnosis not present

## 2022-01-19 DIAGNOSIS — M545 Low back pain, unspecified: Secondary | ICD-10-CM | POA: Diagnosis not present

## 2022-01-19 DIAGNOSIS — R519 Headache, unspecified: Secondary | ICD-10-CM | POA: Insufficient documentation

## 2022-01-19 DIAGNOSIS — M79661 Pain in right lower leg: Secondary | ICD-10-CM | POA: Diagnosis not present

## 2022-01-19 DIAGNOSIS — Z743 Need for continuous supervision: Secondary | ICD-10-CM | POA: Diagnosis not present

## 2022-01-19 DIAGNOSIS — R69 Illness, unspecified: Secondary | ICD-10-CM | POA: Diagnosis not present

## 2022-01-19 LAB — COMPREHENSIVE METABOLIC PANEL
ALT: 14 U/L (ref 0–44)
AST: 14 U/L — ABNORMAL LOW (ref 15–41)
Albumin: 3.5 g/dL (ref 3.5–5.0)
Alkaline Phosphatase: 54 U/L (ref 38–126)
Anion gap: 7 (ref 5–15)
BUN: 8 mg/dL (ref 6–20)
CO2: 25 mmol/L (ref 22–32)
Calcium: 9.3 mg/dL (ref 8.9–10.3)
Chloride: 106 mmol/L (ref 98–111)
Creatinine, Ser: 0.73 mg/dL (ref 0.44–1.00)
GFR, Estimated: >60 mL/min (ref >60)
Glucose, Bld: 75 mg/dL (ref 70–99)
Potassium: 4.1 mmol/L (ref 3.5–5.1)
Sodium: 138 mmol/L (ref 135–145)
Total Bilirubin: 0.4 mg/dL (ref 0.3–1.2)
Total Protein: 8.3 g/dL — ABNORMAL HIGH (ref 6.5–8.1)

## 2022-01-19 LAB — CBC WITH DIFFERENTIAL/PLATELET
Abs Immature Granulocytes: 0.01 10*3/uL (ref 0.00–0.07)
Basophils Absolute: 0 10*3/uL (ref 0.0–0.1)
Basophils Relative: 1 %
Eosinophils Absolute: 0 10*3/uL (ref 0.0–0.5)
Eosinophils Relative: 0 %
HCT: 33.3 % — ABNORMAL LOW (ref 36.0–46.0)
Hemoglobin: 10.2 g/dL — ABNORMAL LOW (ref 12.0–15.0)
Immature Granulocytes: 0 %
Lymphocytes Relative: 53 %
Lymphs Abs: 3.8 10*3/uL (ref 0.7–4.0)
MCH: 21.6 pg — ABNORMAL LOW (ref 26.0–34.0)
MCHC: 30.6 g/dL (ref 30.0–36.0)
MCV: 70.6 fL — ABNORMAL LOW (ref 80.0–100.0)
Monocytes Absolute: 0.6 10*3/uL (ref 0.1–1.0)
Monocytes Relative: 8 %
Neutro Abs: 2.7 10*3/uL (ref 1.7–7.7)
Neutrophils Relative %: 38 %
Platelets: 323 10*3/uL (ref 150–400)
RBC: 4.72 MIL/uL (ref 3.87–5.11)
RDW: 15.3 % (ref 11.5–15.5)
WBC: 7.2 10*3/uL (ref 4.0–10.5)
nRBC: 0 % (ref 0.0–0.2)

## 2022-01-19 LAB — ETHANOL: Alcohol, Ethyl (B): 31 mg/dL — ABNORMAL HIGH (ref <10)

## 2022-01-19 LAB — I-STAT BETA HCG BLOOD, ED (MC, WL, AP ONLY): I-stat hCG, quantitative: <5 m[IU]/mL (ref <5)

## 2022-01-19 MED ORDER — OXYCODONE-ACETAMINOPHEN 5-325 MG PO TABS
1.0000 | ORAL_TABLET | Freq: Once | ORAL | Status: AC
  Administered 2022-01-19: 1 via ORAL
  Filled 2022-01-19: qty 1

## 2022-01-19 NOTE — ED Notes (Addendum)
Provider at bedside at this time

## 2022-01-19 NOTE — ED Notes (Signed)
C-collar placed back on patient per request. Patient states "it feels better with it on".

## 2022-01-19 NOTE — ED Notes (Signed)
C-collar removed prior to me entering the room. Provider at bedside at this time
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
## 2022-01-19 NOTE — ED Notes (Signed)
Ct at bedside at this time
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
## 2022-02-24 ENCOUNTER — Encounter: Payer: 59 | Admitting: Nurse Practitioner

## 2022-03-03 DIAGNOSIS — Z3202 Encounter for pregnancy test, result negative: Secondary | ICD-10-CM | POA: Diagnosis not present

## 2022-03-03 DIAGNOSIS — Z202 Contact with and (suspected) exposure to infections with a predominantly sexual mode of transmission: Secondary | ICD-10-CM | POA: Diagnosis not present

## 2022-03-03 DIAGNOSIS — N760 Acute vaginitis: Secondary | ICD-10-CM | POA: Diagnosis not present

## 2022-03-03 DIAGNOSIS — R69 Illness, unspecified: Secondary | ICD-10-CM | POA: Diagnosis not present

## 2022-03-03 DIAGNOSIS — Z30013 Encounter for initial prescription of injectable contraceptive: Secondary | ICD-10-CM | POA: Diagnosis not present

## 2022-03-10 DIAGNOSIS — Z20822 Contact with and (suspected) exposure to covid-19: Secondary | ICD-10-CM | POA: Diagnosis not present

## 2022-03-10 DIAGNOSIS — R0602 Shortness of breath: Secondary | ICD-10-CM | POA: Diagnosis not present

## 2022-03-10 DIAGNOSIS — R69 Illness, unspecified: Secondary | ICD-10-CM | POA: Diagnosis not present

## 2022-03-10 DIAGNOSIS — Z91018 Allergy to other foods: Secondary | ICD-10-CM | POA: Diagnosis not present

## 2022-03-10 DIAGNOSIS — B349 Viral infection, unspecified: Secondary | ICD-10-CM | POA: Diagnosis not present

## 2022-03-10 DIAGNOSIS — M791 Myalgia, unspecified site: Secondary | ICD-10-CM | POA: Diagnosis not present

## 2022-03-26 ENCOUNTER — Encounter: Payer: 59 | Admitting: Nurse Practitioner

## 2022-06-20 DIAGNOSIS — Z3042 Encounter for surveillance of injectable contraceptive: Secondary | ICD-10-CM | POA: Diagnosis not present

## 2022-06-20 DIAGNOSIS — Z3202 Encounter for pregnancy test, result negative: Secondary | ICD-10-CM | POA: Diagnosis not present

## 2022-06-20 DIAGNOSIS — Z3009 Encounter for other general counseling and advice on contraception: Secondary | ICD-10-CM | POA: Diagnosis not present

## 2022-06-20 DIAGNOSIS — R69 Illness, unspecified: Secondary | ICD-10-CM | POA: Diagnosis not present

## 2022-07-24 DIAGNOSIS — T1490XA Injury, unspecified, initial encounter: Secondary | ICD-10-CM | POA: Diagnosis not present
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
## 2022-10-31 DIAGNOSIS — H40033 Anatomical narrow angle, bilateral: Secondary | ICD-10-CM | POA: Diagnosis not present

## 2022-12-16 ENCOUNTER — Ambulatory Visit: Payer: Medicaid Other

## 2022-12-16 VITALS — BP 113/78 | HR 95 | Ht 63.0 in | Wt 195.0 lb

## 2022-12-16 DIAGNOSIS — N912 Amenorrhea, unspecified: Secondary | ICD-10-CM

## 2022-12-16 LAB — POCT URINE PREGNANCY: Preg Test, Ur: NEGATIVE

## 2022-12-16 NOTE — Progress Notes (Signed)
Patient presents to the Mobile unit fro Urine pregnancy test. Last menses is unsure, she had been on Depo Provera for Many years prior. Last Injection proximately around  October, 2023.  G1P0- With Medical induced Abortion.   Upt completed with Negative results. Advised patient to keep track of her cycle and to present to the unit for repeat testing in 3-4 weeks.

## 2023-10-21 ENCOUNTER — Inpatient Hospital Stay (HOSPITAL_BASED_OUTPATIENT_CLINIC_OR_DEPARTMENT_OTHER): Payer: 59

## 2023-10-21 ENCOUNTER — Encounter (HOSPITAL_COMMUNITY): Payer: Self-pay

## 2023-10-21 ENCOUNTER — Inpatient Hospital Stay (HOSPITAL_COMMUNITY): Payer: 59 | Admitting: Anesthesiology

## 2023-10-21 ENCOUNTER — Inpatient Hospital Stay (HOSPITAL_COMMUNITY)
Admission: EM | Admit: 2023-10-21 | Discharge: 2023-10-25 | DRG: 786 | Disposition: A | Discharge status: Home / Self Care | Payer: 59 | Attending: Obstetrics and Gynecology | Admitting: Obstetrics and Gynecology

## 2023-10-21 ENCOUNTER — Other Ambulatory Visit: Payer: Self-pay

## 2023-10-21 DIAGNOSIS — O9962 Diseases of the digestive system complicating childbirth: Secondary | ICD-10-CM | POA: Diagnosis present

## 2023-10-21 DIAGNOSIS — Z59819 Housing instability, housed unspecified: Secondary | ICD-10-CM | POA: Diagnosis present

## 2023-10-21 DIAGNOSIS — O368130 Decreased fetal movements, third trimester, not applicable or unspecified: Principal | ICD-10-CM | POA: Diagnosis present

## 2023-10-21 DIAGNOSIS — O0933 Supervision of pregnancy with insufficient antenatal care, third trimester: Secondary | ICD-10-CM

## 2023-10-21 DIAGNOSIS — Z8249 Family history of ischemic heart disease and other diseases of the circulatory system: Secondary | ICD-10-CM | POA: Diagnosis not present

## 2023-10-21 DIAGNOSIS — K219 Gastro-esophageal reflux disease without esophagitis: Secondary | ICD-10-CM | POA: Diagnosis present

## 2023-10-21 DIAGNOSIS — O99891 Other specified diseases and conditions complicating pregnancy: Secondary | ICD-10-CM

## 2023-10-21 DIAGNOSIS — F17290 Nicotine dependence, other tobacco product, uncomplicated: Secondary | ICD-10-CM | POA: Diagnosis present

## 2023-10-21 DIAGNOSIS — O99344 Other mental disorders complicating childbirth: Secondary | ICD-10-CM | POA: Diagnosis not present

## 2023-10-21 DIAGNOSIS — O99323 Drug use complicating pregnancy, third trimester: Secondary | ICD-10-CM

## 2023-10-21 DIAGNOSIS — Z3A39 39 weeks gestation of pregnancy: Secondary | ICD-10-CM

## 2023-10-21 DIAGNOSIS — F17210 Nicotine dependence, cigarettes, uncomplicated: Secondary | ICD-10-CM | POA: Diagnosis present

## 2023-10-21 DIAGNOSIS — O99334 Smoking (tobacco) complicating childbirth: Secondary | ICD-10-CM | POA: Diagnosis present

## 2023-10-21 DIAGNOSIS — O134 Gestational [pregnancy-induced] hypertension without significant proteinuria, complicating childbirth: Secondary | ICD-10-CM | POA: Diagnosis present

## 2023-10-21 DIAGNOSIS — Z3689 Encounter for other specified antenatal screening: Secondary | ICD-10-CM

## 2023-10-21 DIAGNOSIS — O368190 Decreased fetal movements, unspecified trimester, not applicable or unspecified: Secondary | ICD-10-CM | POA: Diagnosis not present

## 2023-10-21 DIAGNOSIS — O09523 Supervision of elderly multigravida, third trimester: Secondary | ICD-10-CM | POA: Diagnosis not present

## 2023-10-21 DIAGNOSIS — O9902 Anemia complicating childbirth: Secondary | ICD-10-CM | POA: Diagnosis present

## 2023-10-21 DIAGNOSIS — O99324 Drug use complicating childbirth: Secondary | ICD-10-CM | POA: Diagnosis not present

## 2023-10-21 DIAGNOSIS — Z23 Encounter for immunization: Secondary | ICD-10-CM

## 2023-10-21 DIAGNOSIS — O99320 Drug use complicating pregnancy, unspecified trimester: Secondary | ICD-10-CM | POA: Diagnosis present

## 2023-10-21 DIAGNOSIS — B9689 Other specified bacterial agents as the cause of diseases classified elsewhere: Secondary | ICD-10-CM

## 2023-10-21 DIAGNOSIS — R109 Unspecified abdominal pain: Secondary | ICD-10-CM

## 2023-10-21 DIAGNOSIS — Z98891 History of uterine scar from previous surgery: Principal | ICD-10-CM

## 2023-10-21 DIAGNOSIS — Z658 Other specified problems related to psychosocial circumstances: Secondary | ICD-10-CM

## 2023-10-21 HISTORY — DX: Anemia, unspecified: D64.9

## 2023-10-21 HISTORY — DX: Dyspnea, unspecified: R06.00

## 2023-10-21 LAB — WET PREP, GENITAL
Sperm: NONE SEEN
Trich, Wet Prep: NONE SEEN
WBC, Wet Prep HPF POC: >=10 — AB (ref <10)
Yeast Wet Prep HPF POC: NONE SEEN

## 2023-10-21 LAB — CBC
HCT: 34.2 % — ABNORMAL LOW (ref 36.0–46.0)
Hemoglobin: 11.1 g/dL — ABNORMAL LOW (ref 12.0–15.0)
MCH: 24.2 pg — ABNORMAL LOW (ref 26.0–34.0)
MCHC: 32.5 g/dL (ref 30.0–36.0)
MCV: 74.7 fL — ABNORMAL LOW (ref 80.0–100.0)
Platelets: 207 10*3/uL (ref 150–400)
RBC: 4.58 MIL/uL (ref 3.87–5.11)
RDW: 16 % — ABNORMAL HIGH (ref 11.5–15.5)
WBC: 5.9 10*3/uL (ref 4.0–10.5)
nRBC: 0 % (ref 0.0–0.2)

## 2023-10-21 LAB — COMPREHENSIVE METABOLIC PANEL
ALT: 17 U/L (ref 0–44)
AST: 18 U/L (ref 15–41)
Albumin: 2.9 g/dL — ABNORMAL LOW (ref 3.5–5.0)
Alkaline Phosphatase: 149 U/L — ABNORMAL HIGH (ref 38–126)
Anion gap: 12 (ref 5–15)
BUN: 6 mg/dL (ref 6–20)
CO2: 18 mmol/L — ABNORMAL LOW (ref 22–32)
Calcium: 9.1 mg/dL (ref 8.9–10.3)
Chloride: 101 mmol/L (ref 98–111)
Creatinine, Ser: 0.59 mg/dL (ref 0.44–1.00)
GFR, Estimated: >60 mL/min (ref >60)
Glucose, Bld: 68 mg/dL — ABNORMAL LOW (ref 70–99)
Potassium: 4 mmol/L (ref 3.5–5.1)
Sodium: 131 mmol/L — ABNORMAL LOW (ref 135–145)
Total Bilirubin: 0.6 mg/dL (ref 0.0–1.2)
Total Protein: 7 g/dL (ref 6.5–8.1)

## 2023-10-21 LAB — HEMOGLOBIN A1C
Hgb A1c MFr Bld: 5.4 % (ref 4.8–5.6)
Mean Plasma Glucose: 108.28 mg/dL

## 2023-10-21 LAB — TYPE AND SCREEN
ABO/RH(D): O POS
Antibody Screen: NEGATIVE

## 2023-10-21 LAB — HEPATITIS B SURFACE ANTIGEN: Hepatitis B Surface Ag: NONREACTIVE

## 2023-10-21 LAB — RAPID URINE DRUG SCREEN, HOSP PERFORMED
Amphetamines: NOT DETECTED
Barbiturates: NOT DETECTED
Benzodiazepines: NOT DETECTED
Cocaine: NOT DETECTED
Opiates: NOT DETECTED
Tetrahydrocannabinol: NOT DETECTED

## 2023-10-21 LAB — GC/CHLAMYDIA PROBE AMP (~~LOC~~) NOT AT ARMC
Chlamydia: NEGATIVE
Comment: NEGATIVE
Comment: NORMAL
Neisseria Gonorrhea: NEGATIVE

## 2023-10-21 LAB — PROTEIN / CREATININE RATIO, URINE
Creatinine, Urine: 64 mg/dL
Protein Creatinine Ratio: 0.11 mg/mg{creat} (ref 0.00–0.15)
Total Protein, Urine: 7 mg/dL

## 2023-10-21 LAB — ABO/RH: ABO/RH(D): O POS

## 2023-10-21 LAB — HIV ANTIBODY (ROUTINE TESTING W REFLEX): HIV Screen 4th Generation wRfx: NONREACTIVE

## 2023-10-21 LAB — RPR: RPR Ser Ql: NONREACTIVE

## 2023-10-21 MED ORDER — EPHEDRINE 5 MG/ML INJ: 10.0000 mg | INTRAVENOUS | Status: DC | PRN

## 2023-10-21 MED ORDER — OXYCODONE HCL 5 MG PO TABS
5.0000 mg | ORAL_TABLET | Freq: Once | ORAL | Status: AC
  Administered 2023-10-21: 5 mg via ORAL
  Filled 2023-10-21: qty 1

## 2023-10-21 MED ORDER — LACTATED RINGERS IV SOLN
500.0000 mL | INTRAVENOUS | Status: DC | PRN
  Administered 2023-10-21: 500 mL via INTRAVENOUS
  Administered 2023-10-21: 1000 mL via INTRAVENOUS

## 2023-10-21 MED ORDER — ACETAMINOPHEN 325 MG PO TABS: 650.0000 mg | ORAL_TABLET | ORAL | Status: DC | PRN

## 2023-10-21 MED ORDER — LACTATED RINGERS IV SOLN: INTRAVENOUS | Status: DC

## 2023-10-21 MED ORDER — LACTATED RINGERS IV SOLN: 500.0000 mL | Freq: Once | INTRAVENOUS | Status: DC

## 2023-10-21 MED ORDER — ACETAMINOPHEN-CAFFEINE 500-65 MG PO TABS
2.0000 | ORAL_TABLET | Freq: Once | ORAL | Status: AC
  Administered 2023-10-21: 2 via ORAL
  Filled 2023-10-21: qty 2

## 2023-10-21 MED ORDER — SOD CITRATE-CITRIC ACID 500-334 MG/5ML PO SOLN: 30.0000 mL | ORAL | Status: DC | PRN

## 2023-10-21 MED ORDER — OXYCODONE-ACETAMINOPHEN 5-325 MG PO TABS: 2.0000 | ORAL_TABLET | ORAL | Status: DC | PRN

## 2023-10-21 MED ORDER — LIDOCAINE HCL (PF) 1 % IJ SOLN: 30.0000 mL | INTRAMUSCULAR | Status: DC | PRN

## 2023-10-21 MED ORDER — FENTANYL CITRATE (PF) 100 MCG/2ML IJ SOLN
50.0000 ug | INTRAMUSCULAR | Status: DC | PRN
  Administered 2023-10-21 (×4): 100 ug via INTRAVENOUS
  Filled 2023-10-21 (×4): qty 2

## 2023-10-21 MED ORDER — MISOPROSTOL 25 MCG QUARTER TABLET
25.0000 ug | ORAL_TABLET | ORAL | Status: DC
  Administered 2023-10-21: 25 ug via VAGINAL
  Filled 2023-10-21: qty 1

## 2023-10-21 MED ORDER — FENTANYL-BUPIVACAINE-NACL 0.5-0.125-0.9 MG/250ML-% EP SOLN
12.0000 mL/h | EPIDURAL | Status: DC | PRN
  Administered 2023-10-21: 12 mL/h via EPIDURAL
  Filled 2023-10-21: qty 250

## 2023-10-21 MED ORDER — PHENYLEPHRINE 80 MCG/ML (10ML) SYRINGE FOR IV PUSH (FOR BLOOD PRESSURE SUPPORT): 80.0000 ug | PREFILLED_SYRINGE | INTRAVENOUS | Status: DC | PRN

## 2023-10-21 MED ORDER — TERBUTALINE SULFATE 1 MG/ML IJ SOLN
0.2500 mg | Freq: Once | INTRAMUSCULAR | Status: AC | PRN
  Administered 2023-10-21: 0.25 mg via SUBCUTANEOUS
  Filled 2023-10-21: qty 1

## 2023-10-21 MED ORDER — PHENYLEPHRINE 80 MCG/ML (10ML) SYRINGE FOR IV PUSH (FOR BLOOD PRESSURE SUPPORT)
80.0000 ug | PREFILLED_SYRINGE | INTRAVENOUS | Status: DC | PRN
  Filled 2023-10-21: qty 10

## 2023-10-21 MED ORDER — HYDROXYZINE HCL 25 MG PO TABS
50.0000 mg | ORAL_TABLET | Freq: Four times a day (QID) | ORAL | Status: DC | PRN
  Administered 2023-10-21 – 2023-10-22 (×2): 50 mg via ORAL
  Filled 2023-10-21 (×2): qty 1

## 2023-10-21 MED ORDER — ONDANSETRON HCL 4 MG/2ML IJ SOLN: 4.0000 mg | Freq: Four times a day (QID) | INTRAMUSCULAR | Status: DC | PRN

## 2023-10-21 MED ORDER — OXYCODONE-ACETAMINOPHEN 5-325 MG PO TABS: 1.0000 | ORAL_TABLET | ORAL | Status: DC | PRN

## 2023-10-21 MED ORDER — LIDOCAINE HCL (PF) 1 % IJ SOLN
INTRAMUSCULAR | Status: DC | PRN
Start: 2023-10-21 — End: 2023-10-22
  Administered 2023-10-21 (×2): 4 mL via EPIDURAL

## 2023-10-21 MED ORDER — OXYTOCIN BOLUS FROM INFUSION: 333.0000 mL | Freq: Once | INTRAVENOUS | Status: DC

## 2023-10-21 MED ORDER — FLEET ENEMA RE ENEM: 1.0000 | ENEMA | RECTAL | Status: DC | PRN

## 2023-10-21 MED ORDER — METRONIDAZOLE IVPB CUSTOM
2000.0000 mg | Freq: Once | INTRAVENOUS | Status: AC
  Administered 2023-10-21: 2000 mg via INTRAVENOUS
  Filled 2023-10-21: qty 400

## 2023-10-21 MED ORDER — TERBUTALINE SULFATE 1 MG/ML IJ SOLN
0.2500 mg | Freq: Once | INTRAMUSCULAR | Status: AC | PRN
  Administered 2023-10-22: 0.25 mg via SUBCUTANEOUS
  Filled 2023-10-21: qty 1

## 2023-10-21 MED ORDER — OXYTOCIN-SODIUM CHLORIDE 30-0.9 UT/500ML-% IV SOLN
1.0000 m[IU]/min | INTRAVENOUS | Status: DC
Start: 2023-10-21 — End: 2023-10-22
  Administered 2023-10-21 (×2): 2 m[IU]/min via INTRAVENOUS

## 2023-10-21 MED ORDER — MISOPROSTOL 50MCG HALF TABLET
50.0000 ug | ORAL_TABLET | Freq: Once | ORAL | Status: AC
  Administered 2023-10-21: 50 ug via ORAL
  Filled 2023-10-21: qty 1

## 2023-10-21 MED ORDER — DIPHENHYDRAMINE HCL 50 MG/ML IJ SOLN
12.5000 mg | INTRAMUSCULAR | Status: DC | PRN
  Administered 2023-10-21 – 2023-10-22 (×2): 12.5 mg via INTRAVENOUS
  Filled 2023-10-21 (×2): qty 1

## 2023-10-21 MED ORDER — OXYTOCIN-SODIUM CHLORIDE 30-0.9 UT/500ML-% IV SOLN
2.5000 [IU]/h | INTRAVENOUS | Status: DC
  Filled 2023-10-21: qty 500

## 2023-10-21 NOTE — Anesthesia Procedure Notes (Signed)
 Epidural Patient location during procedure: OB Start time: 10/21/2023 10:19 PM End time: 10/21/2023 10:24 PM  Staffing Anesthesiologist: Linton Rump, MD Performed: anesthesiologist   Preanesthetic Checklist Completed: patient identified, IV checked, site marked, risks and benefits discussed, surgical consent, monitors and equipment checked, pre-op evaluation and timeout performed  Epidural Patient position: sitting Prep: DuraPrep and site prepped and draped Patient monitoring: continuous pulse ox and blood pressure Approach: midline Location: L3-L4 Injection technique: LOR saline  Needle:  Needle type: Tuohy  Needle gauge: 17 G Needle length: 9 cm and 9 Needle insertion depth: 7 cm Catheter type: closed end flexible Catheter size: 19 Gauge Catheter at skin depth: 12 cm Test dose: negative  Assessment Events: blood not aspirated, no cerebrospinal fluid, injection not painful, no injection resistance, no paresthesia and negative IV test  Additional Notes The patient has requested an epidural for labor pain management. Risks and benefits including, but not limited to, infection, bleeding, local anesthetic toxicity, headache, hypotension, back pain, block failure, etc. were discussed with the patient. The patient expressed understanding and consented to the procedure. I confirmed that the patient has no bleeding disorders and is not taking blood thinners. I confirmed the patient's last platelet count with the nurse. A time-out was performed immediately prior to the procedure. Please see nursing documentation for vital signs. Sterile technique was used throughout the whole procedure. Once LOR achieved, the epidural catheter threaded easily without resistance. Aspiration of the catheter was negative for blood and CSF. The epidural was dosed slowly and an infusion was started.  1 attempt(s)Reason for block:procedure for pain

## 2023-10-21 NOTE — Anesthesia Preprocedure Evaluation (Signed)
 Anesthesia Evaluation  Patient identified by MRN, date of birth, ID band Patient awake    Reviewed: Allergy & Precautions, NPO status , Patient's Chart, lab work & pertinent test results  History of Anesthesia Complications Negative for: history of anesthetic complications  Airway Mallampati: III  TM Distance: >3 FB Neck ROM: Full    Dental   Pulmonary Current Smoker   Pulmonary exam normal breath sounds clear to auscultation       Cardiovascular negative cardio ROS  Rhythm:Regular Rate:Normal     Neuro/Psych  PSYCHIATRIC DISORDERS (PTSD) Anxiety  Bipolar Disorder Schizophrenia  negative neurological ROS     GI/Hepatic ,GERD  ,,(+)     substance abuse (history)    Endo/Other  negative endocrine ROS    Renal/GU negative Renal ROS     Musculoskeletal   Abdominal  (+) + obese  Peds  Hematology  (+) Blood dyscrasia, anemia Lab Results      Component                Value               Date                      WBC                      5.9                 10/21/2023                HGB                      11.1 (L)            10/21/2023                HCT                      34.2 (L)            10/21/2023                MCV                      74.7 (L)            10/21/2023                PLT                      207                 10/21/2023              Anesthesia Other Findings   Reproductive/Obstetrics (+) Pregnancy                              Anesthesia Physical Anesthesia Plan  ASA: 2  Anesthesia Plan: Epidural   Post-op Pain Management:    Induction:   PONV Risk Score and Plan:   Airway Management Planned: Natural Airway  Additional Equipment:   Intra-op Plan:   Post-operative Plan:   Informed Consent: I have reviewed the patients History and Physical, chart, labs and discussed the procedure including the risks, benefits and alternatives for the proposed  anesthesia with the patient or authorized representative who has indicated his/her understanding and  acceptance.       Plan Discussed with: Anesthesiologist  Anesthesia Plan Comments: (I have discussed risks of neuraxial anesthesia including but not limited to infection, bleeding, nerve injury, back pain, headache, seizures, and failure of block. Patient denies bleeding disorders and is not currently anticoagulated. Labs have been reviewed. Risks and benefits discussed. All patient's questions answered.  )         Anesthesia Quick Evaluation

## 2023-10-21 NOTE — ED Notes (Signed)
Rapid OB at bedside 

## 2023-10-21 NOTE — ED Notes (Signed)
Rapid OB called  

## 2023-10-21 NOTE — MAU Provider Note (Signed)
 History     CSN: [id number]  Arrival date and time: 10/21/23 5284   Event Date/Time   First Provider Initiated Contact with Patient 10/21/23 0912      Chief Complaint  Patient presents with   Labor Eval   HPI Ms. BIANKA LIBERATI is a 38 y.o. year old G37P0010 female at [redacted]w[redacted]d weeks gestation who was transferred to MAU from [facility] reporting DFM and vaginal bleeding. She states, "She is always sleeping, but moving a lot between 0500-1000 every morning. When she wasn't moving hardly at all at 0200, I became worried." Per report of  [facility] RN Shaune Pollack, RN) to MAU charge RN, the EFM at [facility] was concerning for "a couple of late decels." She reports a 10/10 H/A. But has not taken any medication this morning. She has had limited care (hospital visits) to [facility] in [place], [place]. She reports her "Medicaid got shut off" and she has had to move several times this year due to housing issues. She is living in [place] with her husband at this time. She has a h/o polysubstance abuse, but states, "I'm not crazy I haven't used anything of that stuff since the beginning. I had to smoke marijuana in order to be able to eat."  OB History     Gravida  2   Para      Term      Preterm      AB  1   Living         SAB      IAB  1   Ectopic      Multiple      Live Births              Past Medical History:  Diagnosis Date   Bipolar 1 disorder (HCC)    GERD (gastroesophageal reflux disease)    PTSD (post-traumatic stress disorder)    Schizoaffective disorder, bipolar type (HCC)     Past Surgical History:  Procedure Laterality Date   biopsy of stomach     INDUCED ABORTION  2009    Family History  Problem Relation Age of Onset   Breast cancer Mother    Hypertension Father    Diabetes Father    Prostate cancer Father    Colon cancer Neg Hx     Social History   Tobacco Use   Smoking status: Every Day    Current packs/day: 0.25    Types: Cigarettes   Smokeless  tobacco: Never  Vaping Use   Vaping status: Some Days  Substance Use Topics   Alcohol use: Not Currently    Comment: occ   Drug use: Not Currently    Types: Marijuana, Cocaine    Comment: positive UDS 08/11/23    Allergies:  Allergies  Allergen Reactions   Other Anaphylaxis    broccoli    Medications Prior to Admission  Medication Sig Dispense Refill Last Dose/Taking   prenatal vitamin w/FE, FA (PRENATAL 1 + 1) 27-1 MG TABS tablet Take 1 tablet by mouth daily at 12 noon.   Past Week   acetaminophen (TYLENOL) 500 MG tablet Take 1,000-1,500 mg by mouth every 6 (six) hours as needed for moderate pain or headache. (Patient not taking: Reported on 12/16/2022)      cyclobenzaprine (FLEXERIL) 10 MG tablet Take 1 tablet (10 mg total) by mouth 2 (two) times daily as needed for muscle spasms. (Patient not taking: Reported on 12/16/2022) 20 tablet 0    diphenhydrAMINE (BENADRYL) 25  MG tablet Take 25 mg by mouth 2 (two) times daily as needed for allergies. (Patient not taking: Reported on 12/16/2022)       Review of Systems  Constitutional: Negative.   HENT: Negative.    Eyes: Negative.   Respiratory: Negative.    Cardiovascular: Negative.   Gastrointestinal: Negative.   Endocrine: Negative.   Genitourinary:  Positive for pelvic pain (occ.) and vaginal bleeding.       DFM since 0200  Musculoskeletal: Negative.   Skin: Negative.   Allergic/Immunologic: Negative.   Neurological:  Positive for headaches (10/10).  Hematological: Negative.   Psychiatric/Behavioral: Negative.     Physical Exam   Blood pressure 123/89, pulse 69, temperature 98.3 F (36.8 C), temperature source Oral, resp. rate 18, last menstrual period 06/09/2022, SpO2 100%.  Physical Exam Vitals and nursing note reviewed.  Constitutional:      Appearance: Normal appearance. She is obese.  Cardiovascular:     Rate and Rhythm: Normal rate.  Pulmonary:     Effort: Pulmonary effort is normal.  Abdominal:      Palpations: Abdomen is soft.  Genitourinary:    Comments: Cervical exam at [facility] Musculoskeletal:        General: Normal range of motion.  Skin:    General: Skin is warm and dry.  Neurological:     Mental Status: She is alert and oriented to person, place, and time.  Psychiatric:        Mood and Affect: Mood normal.        Behavior: Behavior normal.        Thought Content: Thought content normal.        Judgment: Judgment normal.     MAU Course  Procedures  MDM Prenatal Labs Rapid UDS GBS Wet Prep GC/CT -- Results pending  Excedrin Tension H/A 2 caplets -- improved H/A from 10/10 to 6/10 OB MFM Limited U/S  *Consult with Dr. Macon Large @ 1030 - notified of patient's complaints, assessments, lab & U/S results, recommended tx plan admit for IOL d/t poor prenatal care, bipolar, schizophrenia, and substance abuse history    Results for orders placed or performed during the hospital encounter of 10/21/23 (from the past 24 hours)  Wet prep, genital     Status: Abnormal   Collection Time: 10/21/23  9:20 AM  Result Value Ref Range   Yeast Wet Prep HPF POC NONE SEEN NONE SEEN   Trich, Wet Prep NONE SEEN NONE SEEN   Clue Cells Wet Prep HPF POC PRESENT (A) NONE SEEN   WBC, Wet Prep HPF POC >=10 (A) <10   Sperm NONE SEEN   ABO/Rh     Status: None (Preliminary result)   Collection Time: 10/21/23  9:35 AM  Result Value Ref Range   ABO/RH(D) PENDING   CBC     Status: Abnormal   Collection Time: 10/21/23  9:38 AM  Result Value Ref Range   WBC 5.9 4.0 - 10.5 K/uL   RBC 4.58 3.87 - 5.11 MIL/uL   Hemoglobin 11.1 (L) 12.0 - 15.0 g/dL   HCT 47.8 (L) 29.5 - 62.1 %   MCV 74.7 (L) 80.0 - 100.0 fL   MCH 24.2 (L) 26.0 - 34.0 pg   MCHC 32.5 30.0 - 36.0 g/dL   RDW 30.8 (H) 65.7 - 84.6 %   Platelets 207 150 - 400 K/uL   nRBC 0.0 0.0 - 0.2 %  Rapid urine drug screen (hospital performed)     Status: None   Collection  Time: 10/21/23  9:40 AM  Result Value Ref Range   Opiates NONE DETECTED  NONE DETECTED   Cocaine NONE DETECTED NONE DETECTED   Benzodiazepines NONE DETECTED NONE DETECTED   Amphetamines NONE DETECTED NONE DETECTED   Tetrahydrocannabinol NONE DETECTED NONE DETECTED   Barbiturates NONE DETECTED NONE DETECTED    Assessment and Plan  1. Indication for care in labor or delivery (Primary) - Admit to L&D - Admission orders entered per Ivonne Andrew, CNM  2. Bacterial vaginosis - V. Katrinka Blazing, CNM notified  3. History of maternal substance abuse affecting newborn - UDS negative today  4. NST (non-stress test) reactive - Category 1 EFM  5. [redacted] weeks gestation of pregnancy   *Report given to and care assumed by Dorathy Kinsman, CNM @ [address] CNM 10/21/2023, 9:12 AM

## 2023-10-21 NOTE — ED Provider Notes (Signed)
 [place] EMERGENCY DEPARTMENT AT [facility] Provider Note   CSN: [id number] Arrival date & time: 10/21/23  6213     History  Chief Complaint  Patient presents with   Labor Eval    Amy Salinas is a 38 y.o. female.  Patient with history of schizoaffective disorder, GERD, bipolar 1 disorder, PTSD G1 P0 presents to the emergency department concerned about decreased fetal movements.  Patient also endorses small amounts of clear discharge beginning at 8 PM yesterday with a very small amount of spotting noted.  Patient states that the child is supposed to be due on March 1.  She does endorse some mild cramping/intermittent contractions.  She has not been having these contractions.  Patient endorses minimal prenatal care with last prenatal visit on November 8.    HPI     Home Medications Prior to Admission medications   Medication Sig Start Date End Date Taking? Authorizing Provider  acetaminophen (TYLENOL) 500 MG tablet Take 1,000-1,500 mg by mouth every 6 (six) hours as needed for moderate pain or headache. Patient not taking: Reported on 12/16/2022    [provider]  cyclobenzaprine (FLEXERIL) 10 MG tablet Take 1 tablet (10 mg total) by mouth 2 (two) times daily as needed for muscle spasms. Patient not taking: Reported on 12/16/2022 07/14/21   Roemhildt, Lorin T, PA-C  diphenhydrAMINE (BENADRYL) 25 MG tablet Take 25 mg by mouth 2 (two) times daily as needed for allergies. Patient not taking: Reported on 12/16/2022    [provider]      Allergies    Other    Review of Systems   Review of Systems  Physical Exam Updated Vital Signs BP (!) 139/93 (BP Location: Left Arm)   Pulse 75   Temp 98.5 F (36.9 C) (Oral)   Resp 18   LMP 06/09/2022   SpO2 100%  Physical Exam Vitals and nursing note reviewed.  Constitutional:      General: She is not in acute distress.    Appearance: She is well-developed.  HENT:     Head: Normocephalic and  atraumatic.  Eyes:     Conjunctiva/sclera: Conjunctivae normal.  Cardiovascular:     Rate and Rhythm: Normal rate and regular rhythm.     Heart sounds: No murmur heard. Pulmonary:     Effort: Pulmonary effort is normal. No respiratory distress.     Breath sounds: Normal breath sounds.  Musculoskeletal:        General: No swelling.     Cervical back: Neck supple.  Skin:    General: Skin is warm and dry.     Capillary Refill: Capillary refill takes less than 2 seconds.  Neurological:     Mental Status: She is alert.  Psychiatric:        Mood and Affect: Mood normal.     ED Results / Procedures / Treatments   Labs (all labs ordered are listed, but only abnormal results are displayed) Labs Reviewed - No data to display  EKG None  Radiology No results found.  Procedures Procedures    Medications Ordered in ED Medications - No data to display  ED Course/ Medical Decision Making/ A&P Clinical Course as of 10/21/23 0634  Wed Oct 21, 2023  0601 Rapid OB at bedside [LM]    Clinical Course User Index [LM] Pamala Duffel  Medical Decision Making  This patient presents to the ED for concern of decreased fetal movements, possible contractions, this involves an extensive number of treatment options, and is a complaint that carries with it a high risk of complications and morbidity.  The differential diagnosis includes active labor, Braxton Hicks contractions, others   Co morbidities that complicate the patient evaluation  Bipolar 1 disorder, schizoaffective disorder   Additional history obtained:  Additional history obtained from significant other at bedside External records from outside source obtained and reviewed including outside emergency department visits, Patient with history of occasional cocaine, THC, alcohol use during pregnancy with documented results and outside emergency department visits.     Consultations  Obtained:         Rapid OB nurse paged and came to bedside.    Social Determinants of Health:  Patient smokes tobacco daily, has food security concerns   Test / Admission - Considered:  Plan to transport to [facility] for further pregnancy monitoring.          Final Clinical Impression(s) / ED Diagnoses Final diagnoses:  Uterine contractions    Rx / DC Orders ED Discharge Orders     None         Pamala Duffel 10/21/23 1610    Nira Conn, MD 10/21/23 [phone]

## 2023-10-21 NOTE — ED Triage Notes (Signed)
 Pt is G1P0 with minimal prenatal care, due on the 1st, states minimal movement today from the baby, states clear bloody discharge since yesterday.

## 2023-10-21 NOTE — Progress Notes (Signed)
 Patient ID: Amy Salinas, female   DOB: Jun 06, 1986, 38 y.o.   MRN: [id number]  H/A returned after Exedrin dose @ 1610; given oxy IR 5mg  at 1945 and feeling better; difficult to determine symptomatology as she states she only feels dizzy at the peak of ctx but not in between ctx   BPs intermittently elevated, but has occurred >4h apart now (143/86, 139/93 with mostly normal values in between) FHR 120s, +accels, occ mi variables Ctx q 2-3 mins w Pit @ 47mu/min Cx 3/90/vtx -3 @ 1800 @ start of Pit  Neg pre-e labs  IUP@39 .3wks IOL process New dx gHTN  Will continue to monitor H/A and BPs; at this point hesitant to call pre-e as BPs have only been mildly elevated and symptoms seem intermittent and difficult to nail down Continue uptitrating Pit with plan for AROM after a few hours if pt agreeable  Arabella Merles [facility] 10/21/2023 8:38 PM

## 2023-10-21 NOTE — Progress Notes (Signed)
 Labor Progress Note Amy Salinas is a 38 y.o. G2P0010 at [redacted]w[redacted]d presented for DFM and vaginal bleeding. S: Patient reports doing well. Pain with contractions for which she would like to start with IV pain medicine and progress to epidural later. She is agreeable to foley or pitocin.   O:  BP (!) 121/98   Pulse 79   Temp 97.9 F (36.6 C) (Oral)   Resp 18   Ht 5\' 2"  (1.575 m)   Wt 92.6 kg   LMP 06/09/2022   SpO2 100%   BMI 37.35 kg/m  EFM: 120/moderate variability/+accels/-decels  CVE: Dilation: 3 Effacement (%): 90 Cervical Position: Posterior Station: -3 Presentation: Vertex Exam by:: [place] CNM   A&P: 38 y.o. G2P0010 [redacted]w[redacted]d here for DFM and vaginal bleeding, now undergoing IOL. #Labor: S/p cytotec 1330. Making progress. Offered AROM vs pitocin and patient opts for pitocin for now. #Pain: IV pain medication, epidural later at patient request #FWB: Category I #GBS  culture collected 10/21/23 #bacterial vaginosis: will do flagyl 2g single dose #Hx maternal polysubstance use: informed peds and [facility] social work   Psychiatric nurse L. De Burrs, MD Family Medicine, PGY-1 6:07 PM

## 2023-10-21 NOTE — MAU Note (Addendum)
.  Amy Salinas is a 38 y.o. at [redacted]w[redacted]d here in MAU reporting: Transfer from [facility] with c/o decreased FM and bloody discharge. NO c/o SROM, regular contractions, chest pain or visual disturbances. EFM explained and applied to soft non tender abd. Pt upon arrival here also reports HA currently. Pt reports currently living in [place] with husband after moving several times this year with housing issues. States her medicaid got shut off. Minimal PNC received mostly at hospital visits. Pos UDS 08/11/23, reports also lossing several teeth this pregnancy. Pt reports 1 [place] in [place] at beginning of pregnancy. Pt unsure abd GDM due to not doing the test.    Onset of complaint: 0500 Pain score: 6/10 Vitals:   10/21/23 0813 10/21/23 0815  BP: 123/89   Pulse: 69   Resp: 18   Temp: 98.3 F (36.8 C)   SpO2:  100%     FHT: 120

## 2023-10-21 NOTE — H&P (Signed)
 Obstetric H&P History    CSN: [id number]   Arrival date and time: 10/21/23 4098    Event Date/Time   First Provider Initiated Contact with Patient 10/21/23 0912          Chief Complaint  Patient presents with   Labor Eval    HPI [name] is a 38 y.o. year old G53P0010 female at [redacted]w[redacted]d weeks gestation who was transferred to MAU from [facility] reporting DFM and vaginal bleeding. She states, "She is always sleeping, but moving a lot between 0500-1000 every morning. When she wasn't moving hardly at all at 0200, I became worried." Per report of  RROB RN Shaune Pollack, RN) to MAU charge RN, the EFM at [facility] was concerning for "a couple of late decels." She reports a 10/10 H/A. But has not taken any medication this morning. She has had limited care (hospital visits) to [facility] in [place], [place]. She reports her "Medicaid got shut off" and she has had to move several times this year due to housing issues. She is living in [place] with her husband at this time. She has a h/o polysubstance abuse, but states, "I'm not crazy I haven't used anything of that stuff since the beginning. 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 [ ]  yes   [ ]  no    DME Rx [ ]  BP cuff [ ]  Weight Scale Waterbirth  [ ]  Class [ ]  Consent [ ]  CNM visit  PHQ9 & GAD7 [  ] new OB [  ] 28 weeks  [  ] 36 weeks Induction  [ ]  Orders Entered [ ] Foley Y/N      OB History       Gravida  2   Para      Term      Preterm      AB  1   Living           SAB      IAB  1   Ectopic      Multiple      Live Births                      Past  Medical History:  Diagnosis Date   Bipolar 1 disorder (HCC)     GERD (gastroesophageal reflux disease)     PTSD (post-traumatic stress disorder)     Schizoaffective disorder, bipolar type (HCC)                 Past Surgical History:  Procedure Laterality Date   biopsy of stomach       INDUCED ABORTION   2009               Family History  Problem Relation Age of Onset   Breast cancer Mother     Hypertension Father     Diabetes Father     Prostate cancer Father     Colon cancer Neg Hx            Social History  Social History         Tobacco Use   Smoking status: Every Day      Current packs/day: 0.25      Types: Cigarettes   Smokeless tobacco: Never  Vaping Use   Vaping status: Some Days  Substance Use Topics   Alcohol use: Not Currently      Comment: occ   Drug use: Not Currently      Types: Marijuana, Cocaine      Comment: positive UDS 08/11/23        Allergies:  Allergies       Allergies  Allergen Reactions   Other Anaphylaxis      broccoli               Medications Prior to Admission  Medication Sig Dispense Refill Last Dose/Taking   prenatal vitamin w/FE, FA (PRENATAL 1 + 1) 27-1 MG TABS tablet Take 1 tablet by mouth daily at 12 noon.     Past Week   acetaminophen (TYLENOL) 500 MG tablet Take 1,000-1,500 mg by mouth every 6 (six) hours as needed for moderate pain or headache.  (Patient not taking: Reported on 12/16/2022)         cyclobenzaprine (FLEXERIL) 10 MG tablet Take 1 tablet (10 mg total) by mouth 2 (two) times daily as needed for muscle spasms. (Patient not taking: Reported on 12/16/2022) 20 tablet 0     diphenhydrAMINE (BENADRYL) 25 MG tablet Take 25 mg by mouth 2 (two) times daily as needed for allergies. (Patient not taking: Reported on 12/16/2022)                Review of Systems  Constitutional: Negative.   HENT: Negative.    Eyes: Negative.   Respiratory: Negative.    Cardiovascular: Negative.   Gastrointestinal: Negative.   Endocrine: Negative.   Genitourinary:  Positive for pelvic pain (occ.) and vaginal bleeding.       DFM since 0200  Musculoskeletal: Negative.   Skin: Negative.   Allergic/Immunologic: Negative.   Neurological:  Positive for headaches (10/10).  Hematological: Negative.   Psychiatric/Behavioral: Negative.      Physical Exam    Blood pressure 123/89, pulse 69, temperature 98.3 F (36.8 C), temperature source Oral, resp. rate 18, last menstrual period 06/09/2022, SpO2 100%.   Physical Exam Vitals and nursing note reviewed.  Constitutional:      Appearance: Normal appearance. She is obese.  Cardiovascular:     Rate and Rhythm: Normal rate.  Pulmonary:     Effort: Pulmonary effort is normal.  Abdominal:     Palpations: Abdomen is soft.  Genitourinary:    Comments: Cervical exam at  [facility] Musculoskeletal:        General: Normal range of motion.  Skin:    General: Skin is warm and dry.  Neurological:     Mental Status: She is alert and oriented to person, place, and time.  Psychiatric:        Mood and Affect: Mood normal.        Behavior: Behavior normal.        Thought Content: Thought content normal.        Judgment: Judgment normal.        MAU Course  Procedures   MDM Prenatal Labs Rapid UDS GBS Wet Prep GC/CT -- Results pending  Excedrin Tension H/A 2 caplets -- improved H/A from 10/10 to 6/10 OB  MFM Limited U/S   *Consult with Dr. Macon Large @ 1030 - notified of patient's complaints, assessments, lab & U/S results, recommended tx plan admit for IOL d/t poor prenatal care, bipolar, schizophrenia, and substance abuse history    Lab Results Last 24 Hours       Results for orders placed or performed during the hospital encounter of 10/21/23 (from the past 24 hours)  Wet prep, genital     Status: Abnormal    Collection Time: 10/21/23  9:20 AM  Result Value Ref Range    Yeast Wet Prep HPF POC NONE SEEN NONE SEEN    Trich, Wet Prep NONE SEEN NONE SEEN    Clue Cells Wet Prep HPF POC PRESENT (A) NONE SEEN    WBC, Wet Prep HPF POC >=10 (A) <10    Sperm NONE SEEN    ABO/Rh     Status: None (Preliminary result)    Collection Time: 10/21/23  9:35 AM  Result Value Ref Range    ABO/RH(D) PENDING    CBC     Status: Abnormal    Collection Time: 10/21/23  9:38 AM  Result Value Ref Range    WBC 5.9 4.0 - 10.5 K/uL    RBC 4.58 3.87 - 5.11 MIL/uL    Hemoglobin 11.1 (L) 12.0 - 15.0 g/dL    HCT 78.2 (L) 95.6 - 46.0 %    MCV 74.7 (L) 80.0 - 100.0 fL    MCH 24.2 (L) 26.0 - 34.0 pg    MCHC 32.5 30.0 - 36.0 g/dL    RDW 21.3 (H) 08.6 - 15.5 %    Platelets 207 150 - 400 K/uL    nRBC 0.0 0.0 - 0.2 %  Rapid urine drug screen (hospital performed)     Status: None    Collection Time: 10/21/23  9:40 AM  Result Value Ref Range    Opiates NONE DETECTED NONE DETECTED    Cocaine NONE DETECTED NONE DETECTED    Benzodiazepines NONE DETECTED NONE DETECTED    Amphetamines NONE DETECTED NONE DETECTED    Tetrahydrocannabinol NONE DETECTED NONE DETECTED    Barbiturates NONE DETECTED NONE DETECTED      Assessment and Plan  1. Indication for care in labor or delivery (Primary) - Admit to L&D - Admission orders entered per Ivonne Andrew, CNM - Recommend Foley balloon and Cytotec then AROM, pitocin PRN. Pt declined foley for now.    2. Bacterial vaginosis - Flagyl   3. History of maternal substance abuse  affecting newborn - UDS negative today - Informed Peds   4. NST (non-stress test) reactive - Category 1 EFM   5. [redacted] weeks gestation of pregnancy -  6. Decrease fetal mvmt - Category I tracing in MAU.  -  IOL  7. Psychosocial Stressors/Housing insecurity - Discussed with SW and Peds that pt is staying in a hotel and plans to go to [place] soon after delivery and does not have plans for baby or mom follow-up appts. They will address after delivery.    [place], CNM 10/21/2023 11:05 AM

## 2023-10-21 NOTE — Progress Notes (Signed)
 Received call from [facility] regarding patient presenting for DFM and vaginal bleeding. 7829- arrived to patient room, monitors on and assessing. Patient verbalizes she is having screwdriver pains, unsure if they are contractions as she doesn't know what that feels like, expresses that baby hasn't moved very much since 2pm 10/20/23. Patient states she is high risk but is unable to explain why other than being AMA and states she was told early in her pregnancy she "had a place on her placenta that has resolved" per ultrasound. Patient verbalized she hasn't seen her OB since December as she lost her insurance. Patient abdomen is soft to palpation.   Spoke to [facility] Attending and provided assessment and FHT/Contractions. Patient is to be checked and then transferred to MAU for R/O labor and any other testing/assessments deemed necessary.   0737-Patient removed from monitors for transport to MAU.  Lovenia Shuck, RN RROB

## 2023-10-22 ENCOUNTER — Encounter (HOSPITAL_COMMUNITY): Payer: Self-pay | Admitting: Family Medicine

## 2023-10-22 ENCOUNTER — Encounter (HOSPITAL_COMMUNITY)
Admission: EM | Disposition: A | Discharge status: Home / Self Care | Payer: Self-pay | Source: Home / Self Care | Attending: Obstetrics and Gynecology

## 2023-10-22 DIAGNOSIS — Z3A39 39 weeks gestation of pregnancy: Secondary | ICD-10-CM

## 2023-10-22 DIAGNOSIS — O99324 Drug use complicating childbirth: Secondary | ICD-10-CM | POA: Diagnosis not present

## 2023-10-22 DIAGNOSIS — O368130 Decreased fetal movements, third trimester, not applicable or unspecified: Secondary | ICD-10-CM | POA: Diagnosis not present

## 2023-10-22 DIAGNOSIS — O99344 Other mental disorders complicating childbirth: Secondary | ICD-10-CM | POA: Diagnosis not present

## 2023-10-22 DIAGNOSIS — O99334 Smoking (tobacco) complicating childbirth: Secondary | ICD-10-CM | POA: Diagnosis not present

## 2023-10-22 LAB — RUBELLA SCREEN: Rubella: 1.1 {index} (ref >0.99)

## 2023-10-22 LAB — CBC
HCT: 35.9 % — ABNORMAL LOW (ref 36.0–46.0)
Hemoglobin: 11.6 g/dL — ABNORMAL LOW (ref 12.0–15.0)
MCH: 24.7 pg — ABNORMAL LOW (ref 26.0–34.0)
MCHC: 32.3 g/dL (ref 30.0–36.0)
MCV: 76.5 fL — ABNORMAL LOW (ref 80.0–100.0)
Platelets: 190 10*3/uL (ref 150–400)
RBC: 4.69 MIL/uL (ref 3.87–5.11)
RDW: 16.2 % — ABNORMAL HIGH (ref 11.5–15.5)
WBC: 8.2 10*3/uL (ref 4.0–10.5)
nRBC: 0 % (ref 0.0–0.2)

## 2023-10-22 SURGERY — Surgical Case
Anesthesia: Epidural | Site: Abdomen

## 2023-10-22 MED ORDER — LIDOCAINE-EPINEPHRINE (PF) 2 %-1:200000 IJ SOLN
INTRAMUSCULAR | Status: DC | PRN
  Administered 2023-10-22: 5 mL via EPIDURAL
  Administered 2023-10-22: 10 mL via EPIDURAL

## 2023-10-22 MED ORDER — WITCH HAZEL-GLYCERIN EX PADS: 1.0000 | MEDICATED_PAD | CUTANEOUS | Status: DC | PRN

## 2023-10-22 MED ORDER — TRANEXAMIC ACID-NACL 1000-0.7 MG/100ML-% IV SOLN
INTRAVENOUS | Status: AC
  Filled 2023-10-22: qty 100

## 2023-10-22 MED ORDER — DEXAMETHASONE SODIUM PHOSPHATE 10 MG/ML IJ SOLN
INTRAMUSCULAR | Status: DC | PRN
  Administered 2023-10-22: 10 mg via INTRAVENOUS

## 2023-10-22 MED ORDER — COCONUT OIL OIL: 1.0000 | TOPICAL_OIL | Status: DC | PRN

## 2023-10-22 MED ORDER — DIPHENHYDRAMINE HCL 25 MG PO CAPS
25.0000 mg | ORAL_CAPSULE | ORAL | Status: DC | PRN
  Administered 2023-10-22: 25 mg via ORAL

## 2023-10-22 MED ORDER — ZOLPIDEM TARTRATE 5 MG PO TABS: 5.0000 mg | ORAL_TABLET | Freq: Every evening | ORAL | Status: DC | PRN

## 2023-10-22 MED ORDER — METHYLERGONOVINE MALEATE 0.2 MG/ML IJ SOLN
INTRAMUSCULAR | Status: AC
  Filled 2023-10-22: qty 1

## 2023-10-22 MED ORDER — CEFAZOLIN SODIUM-DEXTROSE 2-3 GM-%(50ML) IV SOLR
INTRAVENOUS | Status: DC | PRN
  Administered 2023-10-22: 2 g via INTRAVENOUS

## 2023-10-22 MED ORDER — PROMETHAZINE (PHENERGAN) 6.25MG IN NS 50ML IVPB: 6.2500 mg | INTRAVENOUS | Status: DC | PRN

## 2023-10-22 MED ORDER — PHENYLEPHRINE 80 MCG/ML (10ML) SYRINGE FOR IV PUSH (FOR BLOOD PRESSURE SUPPORT)
PREFILLED_SYRINGE | INTRAVENOUS | Status: DC | PRN
  Administered 2023-10-22: 160 ug via INTRAVENOUS

## 2023-10-22 MED ORDER — SODIUM CHLORIDE 0.9 % IR SOLN
Status: DC | PRN
  Administered 2023-10-22: 1

## 2023-10-22 MED ORDER — ACETAMINOPHEN 10 MG/ML IV SOLN
INTRAVENOUS | Status: DC | PRN
Start: 2023-10-22 — End: 2023-10-22
  Administered 2023-10-22: 1000 mg via INTRAVENOUS

## 2023-10-22 MED ORDER — ACETAMINOPHEN 500 MG PO TABS
1000.0000 mg | ORAL_TABLET | Freq: Four times a day (QID) | ORAL | Status: DC
  Administered 2023-10-22 – 2023-10-25 (×13): 1000 mg via ORAL
  Filled 2023-10-22 (×13): qty 2

## 2023-10-22 MED ORDER — METHYLERGONOVINE MALEATE 0.2 MG/ML IJ SOLN
0.2000 mg | Freq: Once | INTRAMUSCULAR | Status: AC
Start: 2023-10-22 — End: 2023-10-22
  Administered 2023-10-22: 0.2 mg via INTRAMUSCULAR

## 2023-10-22 MED ORDER — OXYCODONE HCL 5 MG PO TABS: 5.0000 mg | ORAL_TABLET | Freq: Once | ORAL | Status: DC | PRN

## 2023-10-22 MED ORDER — OXYTOCIN-SODIUM CHLORIDE 30-0.9 UT/500ML-% IV SOLN: 2.5000 [IU]/h | INTRAVENOUS | Status: AC

## 2023-10-22 MED ORDER — DIBUCAINE (PERIANAL) 1 % EX OINT: 1.0000 | TOPICAL_OINTMENT | CUTANEOUS | Status: DC | PRN

## 2023-10-22 MED ORDER — LACTATED RINGERS IV SOLN: INTRAVENOUS | Status: DC | PRN

## 2023-10-22 MED ORDER — DIPHENHYDRAMINE HCL 25 MG PO CAPS
25.0000 mg | ORAL_CAPSULE | Freq: Four times a day (QID) | ORAL | Status: DC | PRN
  Administered 2023-10-22 (×2): 25 mg via ORAL
  Filled 2023-10-22 (×3): qty 1

## 2023-10-22 MED ORDER — ENOXAPARIN SODIUM 40 MG/0.4ML IJ SOSY
40.0000 mg | PREFILLED_SYRINGE | INTRAMUSCULAR | Status: DC
  Administered 2023-10-22 – 2023-10-24 (×3): 40 mg via SUBCUTANEOUS
  Filled 2023-10-22 (×3): qty 0.4

## 2023-10-22 MED ORDER — MENTHOL 3 MG MT LOZG: 1.0000 | LOZENGE | OROMUCOSAL | Status: DC | PRN

## 2023-10-22 MED ORDER — TETANUS-DIPHTH-ACELL PERTUSSIS 5-2.5-18.5 LF-MCG/0.5 IM SUSY
0.5000 mL | PREFILLED_SYRINGE | Freq: Once | INTRAMUSCULAR | Status: AC
  Administered 2023-10-23: 0.5 mL via INTRAMUSCULAR
  Filled 2023-10-22: qty 0.5

## 2023-10-22 MED ORDER — ALBUMIN HUMAN 5 % IV SOLN
12.5000 g | Freq: Once | INTRAVENOUS | Status: AC
  Administered 2023-10-22: 12.5 g via INTRAVENOUS
  Filled 2023-10-22: qty 250

## 2023-10-22 MED ORDER — OXYTOCIN-SODIUM CHLORIDE 30-0.9 UT/500ML-% IV SOLN
INTRAVENOUS | Status: DC | PRN
  Administered 2023-10-22: 300 mL via INTRAVENOUS

## 2023-10-22 MED ORDER — TRANEXAMIC ACID-NACL 1000-0.7 MG/100ML-% IV SOLN
1000.0000 mg | Freq: Once | INTRAVENOUS | Status: AC
  Administered 2023-10-22: 1000 mg via INTRAVENOUS

## 2023-10-22 MED ORDER — ONDANSETRON HCL 4 MG/2ML IJ SOLN
INTRAMUSCULAR | Status: DC | PRN
  Administered 2023-10-22: 4 mg via INTRAVENOUS

## 2023-10-22 MED ORDER — IBUPROFEN 600 MG PO TABS
600.0000 mg | ORAL_TABLET | Freq: Four times a day (QID) | ORAL | Status: DC
  Administered 2023-10-23 – 2023-10-25 (×10): 600 mg via ORAL
  Filled 2023-10-22 (×10): qty 1

## 2023-10-22 MED ORDER — DEXMEDETOMIDINE HCL IN NACL 80 MCG/20ML IV SOLN
INTRAVENOUS | Status: DC | PRN
  Administered 2023-10-22: 8 ug via INTRAVENOUS

## 2023-10-22 MED ORDER — HYDROMORPHONE HCL 1 MG/ML IJ SOLN: 0.2000 mg | INTRAMUSCULAR | Status: DC | PRN

## 2023-10-22 MED ORDER — PRENATAL MULTIVITAMIN CH
1.0000 | ORAL_TABLET | Freq: Every day | ORAL | Status: DC
  Administered 2023-10-22 – 2023-10-25 (×4): 1 via ORAL
  Filled 2023-10-22 (×4): qty 1

## 2023-10-22 MED ORDER — LACTATED RINGERS AMNIOINFUSION: INTRAVENOUS | Status: DC

## 2023-10-22 MED ORDER — KETOROLAC TROMETHAMINE 30 MG/ML IJ SOLN
INTRAMUSCULAR | Status: AC
  Filled 2023-10-22: qty 1

## 2023-10-22 MED ORDER — STERILE WATER FOR IRRIGATION IR SOLN
Status: DC | PRN
  Administered 2023-10-22: 1000 mL

## 2023-10-22 MED ORDER — OXYCODONE HCL 5 MG PO TABS
5.0000 mg | ORAL_TABLET | ORAL | Status: DC | PRN
  Administered 2023-10-22 – 2023-10-24 (×9): 10 mg via ORAL
  Filled 2023-10-22 (×10): qty 2

## 2023-10-22 MED ORDER — KETOROLAC TROMETHAMINE 30 MG/ML IJ SOLN
30.0000 mg | Freq: Once | INTRAMUSCULAR | Status: AC | PRN
  Administered 2023-10-22: 30 mg via INTRAVENOUS

## 2023-10-22 MED ORDER — OXYCODONE HCL 5 MG/5ML PO SOLN: 5.0000 mg | Freq: Once | ORAL | Status: DC | PRN

## 2023-10-22 MED ORDER — KETOROLAC TROMETHAMINE 30 MG/ML IJ SOLN
30.0000 mg | Freq: Four times a day (QID) | INTRAMUSCULAR | Status: AC
  Administered 2023-10-22 (×3): 30 mg via INTRAVENOUS
  Filled 2023-10-22 (×3): qty 1

## 2023-10-22 MED ORDER — SODIUM CHLORIDE 0.9 % IV SOLN
INTRAVENOUS | Status: DC | PRN
  Administered 2023-10-22: 500 mg via INTRAVENOUS

## 2023-10-22 MED ORDER — GABAPENTIN 100 MG PO CAPS
100.0000 mg | ORAL_CAPSULE | Freq: Three times a day (TID) | ORAL | Status: DC
  Administered 2023-10-22 – 2023-10-25 (×10): 100 mg via ORAL
  Filled 2023-10-22 (×10): qty 1

## 2023-10-22 MED ORDER — SIMETHICONE 80 MG PO CHEW
80.0000 mg | CHEWABLE_TABLET | Freq: Three times a day (TID) | ORAL | Status: DC
  Administered 2023-10-22 – 2023-10-25 (×11): 80 mg via ORAL
  Filled 2023-10-22 (×11): qty 1

## 2023-10-22 MED ORDER — SIMETHICONE 80 MG PO CHEW: 80.0000 mg | CHEWABLE_TABLET | ORAL | Status: DC | PRN

## 2023-10-22 MED ORDER — FENTANYL CITRATE (PF) 100 MCG/2ML IJ SOLN: 25.0000 ug | INTRAMUSCULAR | Status: DC | PRN

## 2023-10-22 MED ORDER — MORPHINE SULFATE (PF) 0.5 MG/ML IJ SOLN
INTRAMUSCULAR | Status: DC | PRN
  Administered 2023-10-22: 3 mg via EPIDURAL

## 2023-10-22 MED ORDER — SENNOSIDES-DOCUSATE SODIUM 8.6-50 MG PO TABS
2.0000 | ORAL_TABLET | Freq: Every day | ORAL | Status: DC
  Administered 2023-10-23 – 2023-10-25 (×3): 2 via ORAL
  Filled 2023-10-22 (×3): qty 2

## 2023-10-22 SURGICAL SUPPLY — 22 items
DRSG OPSITE POSTOP 4X10 (GAUZE/BANDAGES/DRESSINGS) ×1 IMPLANT
ELECT REM PT RETURN 9FT ADLT (ELECTROSURGICAL) ×1 IMPLANT
ELECTRODE REM PT RTRN 9FT ADLT (ELECTROSURGICAL) ×1 IMPLANT
GLOVE BIO SURGEON STRL SZ 6 (GLOVE) ×1 IMPLANT
GLOVE BIOGEL PI IND STRL 7.0 (GLOVE) ×2 IMPLANT
GOWN STRL REUS W/TWL LRG LVL3 (GOWN DISPOSABLE) ×2 IMPLANT
KIT ABG SYR 3ML LUER SLIP (SYRINGE) IMPLANT
NDL HYPO 25X5/8 SAFETYGLIDE (NEEDLE) IMPLANT
NEEDLE HYPO 22GX1.5 SAFETY (NEEDLE) IMPLANT
NEEDLE HYPO 25X5/8 SAFETYGLIDE (NEEDLE) IMPLANT
NS IRRIG 1000ML POUR BTL (IV SOLUTION) ×1 IMPLANT
PACK C SECTION WH (CUSTOM PROCEDURE TRAY) ×1 IMPLANT
PAD OB MATERNITY 4.3X12.25 (PERSONAL CARE ITEMS) ×1 IMPLANT
RETRACTOR WND ALEXIS 25 LRG (MISCELLANEOUS) IMPLANT
RTRCTR WOUND ALEXIS 25CM LRG (MISCELLANEOUS) IMPLANT
SUT MON AB 4-0 PS1 27 (SUTURE) ×1 IMPLANT
SUT PLAIN 0 NONE (SUTURE) ×1 IMPLANT
SUT VIC AB 0 CT1 36 (SUTURE) ×3 IMPLANT
SUT VIC AB 2-0 CT1 TAPERPNT 27 (SUTURE) ×1 IMPLANT
SYR CONTROL 10ML LL (SYRINGE) IMPLANT
TOWEL OR 17X24 6PK STRL BLUE (TOWEL DISPOSABLE) ×1 IMPLANT
WATER STERILE IRR 1000ML POUR (IV SOLUTION) ×1 IMPLANT

## 2023-10-22 NOTE — Discharge Summary (Signed)
 Postpartum Discharge Summary    Patient Name: Amy Salinas DOB: 02/11/1986 MRN: [id number]  Date of admission: 10/21/2023 Delivery date:10/22/2023 Delivering provider: Milas Hock Date of discharge: 10/25/2023  Admitting diagnosis: Uterine contractions [O47.9] Decreased fetal movement affecting management of pregnancy in third trimester [O36.8130] Intrauterine pregnancy: [redacted]w[redacted]d     Secondary diagnosis:  Principal Problem:   Decreased fetal movement affecting management of pregnancy in third trimester Active Problems:   History of maternal substance abuse affecting newborn   Drug use complicating pregnancy   Psychosocial stressors   Housing insecurity   Cesarean delivery delivered  Additional problems: none    Discharge diagnosis: Term Pregnancy Delivered                                              Post partum procedures: Depo Augmentation: AROM, Pitocin, and Cytotec Complications: None  Hospital course: Induction of Labor With Cesarean Section   38 y.o. yo G2P1011 at [redacted]w[redacted]d was admitted to the hospital 10/21/2023 for induction of labor. Patient had a labor course significant for fetal heart rates changes. The patient went for cesarean section due to Non-Reassuring FHR. Delivery details are as follows: Membrane Rupture Time/Date: 2:01 AM,10/22/2023  Delivery Method:C-Section, Low Transverse Operative Delivery:N/A Details of operation can be found in separate operative Note.  Patient had a postpartum course complicated by prolonged decelerations, necessitating Terbutaline, decel down to 50s, at which point proceeded with stat cesarean delivery. She is ambulating, tolerating a regular diet, passing flatus, and urinating well.  Patient is discharged home in stable condition on 10/25/23.   Declined birth control prior to D/C due concerns about milk supply. Recommend two year pregnancy to reduce risk of uterine rupture.    Pt plans to move to [place] as soon as possible after  D/C. Reiterated routine post-op care, importance of knowing who to call and where to go in an emergency and need for F/U in 4-6 weeks for PP visit. Scheduled 1 week wound check at [facility]. Pt and partner state they will be able to get her to this appt. Pt instructed to call [facility] with non-urgent questions or go to MAU for emergencies.    Newborn Data: Birth date:10/22/2023 Birth time:3:00 AM Gender:Female Living status:Living Apgars:9 ,9  Weight:2560 g                               Magnesium Sulfate received: No BMZ received: No Rhophylac:N/A MMR:No T-DaP: offered pp - received Flu: Yes RSV Vaccine received: No Transfusion:No  Immunizations received: Immunization History  Administered Date(s) Administered   PFIZER(Purple Top)SARS-COV-2 Vaccination 11/23/2019, 12/21/2019   Tdap 10/23/2023    Physical exam  Vitals:   10/24/23 0508 10/24/23 1434 10/24/23 2038 10/25/23 0608  BP: 131/77 129/78 128/86 123/79  Pulse: 97 87 84 72  Resp: 18 18 17 16   Temp: 98.6 F (37 C) 98.8 F (37.1 C) 97.6 F (36.4 C)   TempSrc:  Oral Oral   SpO2: 100% 100%  100%  Weight: 93.3 kg     Height:       General: alert, cooperative, and no distress Lochia: appropriate Uterine Fundus: firm Incision: Healing well with no significant drainage, No significant erythema, Dressing is clean, dry, and intact DVT Evaluation: No evidence of DVT seen on physical exam. Labs: Lab Results  Component Value Date   WBC 8.2 10/22/2023   HGB 11.6 (L) 10/22/2023   HCT 35.9 (L) 10/22/2023   MCV 76.5 (L) 10/22/2023   PLT 190 10/22/2023      Latest Ref Rng & Units 10/21/2023    9:38 AM  CMP  Glucose 70 - 99 mg/dL 68   BUN 6 - 20 mg/dL 6   Creatinine 4.09 - 8.11 mg/dL 9.14   Sodium 782 - 956 mmol/L 131   Potassium 3.5 - 5.1 mmol/L 4.0   Chloride 98 - 111 mmol/L 101   CO2 22 - 32 mmol/L 18   Calcium 8.9 - 10.3 mg/dL 9.1   Total Protein 6.5 - 8.1 g/dL 7.0   Total Bilirubin 0.0 - 1.2 mg/dL 0.6   Alkaline Phos 38  - 126 U/L 149   AST 15 - 41 U/L 18   ALT 0 - 44 U/L 17    Edinburgh Score:    10/23/2023    5:00 AM  Edinburgh Postnatal Depression Scale Screening Tool  I have been able to laugh and see the funny side of things. 0  I have looked forward with enjoyment to things. 1  I have blamed myself unnecessarily when things went wrong. 0  I have been anxious or worried for no good reason. 0  I have felt scared or panicky for no good reason. 0  Things have been getting on top of me. 0  I have been so unhappy that I have had difficulty sleeping. 0  I have felt sad or miserable. 0  I have been so unhappy that I have been crying. 0  The thought of harming myself has occurred to me. 0  Edinburgh Postnatal Depression Scale Total 1   No data recorded   After visit meds:  Allergies as of 10/25/2023       Reactions   Other Anaphylaxis   broccoli        Medication List     TAKE these medications    acetaminophen 500 MG tablet Commonly known as: TYLENOL Take 1,000-1,500 mg by mouth every 6 (six) hours as needed for moderate pain or headache.   coconut oil Oil Apply 1 Application topically as needed.   cyclobenzaprine 10 MG tablet Commonly known as: FLEXERIL Take 1 tablet (10 mg total) by mouth 2 (two) times daily as needed for muscle spasms.   dibucaine 1 % Oint Commonly known as: NUPERCAINAL Place 1 Application rectally as needed for hemorrhoids.   diphenhydrAMINE 25 MG tablet Commonly known as: BENADRYL Take 25 mg by mouth 2 (two) times daily as needed for allergies.   gabapentin 100 MG capsule Commonly known as: NEURONTIN Take 1 capsule (100 mg total) by mouth 3 (three) times daily.   hydrOXYzine 25 MG tablet Commonly known as: ATARAX Take 1 tablet (25 mg total) by mouth every 6 (six) hours as needed for anxiety.   ibuprofen 600 MG tablet Commonly known as: ADVIL Take 1 tablet (600 mg total) by mouth every 6 (six) hours as needed.   oxyCODONE 5 MG immediate release  tablet Commonly known as: Oxy IR/ROXICODONE Take 1 tablet (5 mg total) by mouth every 4 (four) hours as needed for moderate pain (pain score 4-6).   prenatal vitamin w/FE, FA 27-1 MG Tabs tablet Take 1 tablet by mouth daily at 12 noon.   senna-docusate 8.6-50 MG tablet Commonly known as: Senokot-S Take 2 tablets by mouth daily.   witch hazel-glycerin pad Commonly known as: TUCKS Apply  1 Application topically as needed for hemorrhoids.               Discharge Care Instructions  (From admission, onward)           Start     Ordered   10/24/23 0000  Discharge wound care:       Comments: In the shower, let soapy water drip on the wound (don't scrub). Pat it dry. If your skin folds over the incision, put a cloth pad on it to keep it from getting sweaty. Within a week, your wound will be mostly healed. But look out for issues: If you develop a fever or if the skin surrounding the incision turns red, it starts oozing green or pus-colored liquid, or it becomes hard or painful, call your doctor. These could be signs of infection.  You can remove the dressing completely in 7-10 days after your C-section. You may want to put another bandage on to protect your clothes   10/24/23 1144             Discharge home in stable condition Infant Feeding: Bottle Infant Disposition:home with mother Discharge instruction: per After Visit Summary and Postpartum booklet. Activity: Advance as tolerated. Pelvic rest for 6 weeks.  Diet: routine diet Future Appointments: Future Appointments  Date Time Provider Department Center  10/29/2023  2:35 PM [facility] FU [facility]   Follow up Visit:  Follow-up Information     [facility] Follow up in 1 week(s).   Specialty: Obstetrics and Gynecology Why: Incision check Contact information: [address] [place] [address] [phone]               Message sent to  [facility] for incision check  Please schedule this patient for a In person postpartum visit in 4 weeks with the following provider: Any provider. Additional Postpartum F/U:Incision check 1 week  High risk pregnancy complicated by:  sparse prenatal care, substance use disorder Delivery mode:  C-Section, Low Transverse Anticipated Birth Control:  Unsure   10/25/2023 Dorathy Kinsman, CNM

## 2023-10-22 NOTE — Progress Notes (Signed)
 Labor Note  S:  Comfortable with epidural. Called to see patient due to prolonged decel and tracing.   Blood pressure (!) 91/50, pulse 64, temperature 97.6 F (36.4 C), temperature source Oral, resp. rate 18, height 5\' 2"  (1.575 m), weight 92.6 kg, last menstrual period 06/09/2022, SpO2 99%.  FHT reviewed: Tracing through 1:40 am had been 120, mod var + accels. Then developed several decels to the 80s after a run of contractions and then she developed a prolonged decel at 210 after AROM and FSE/IUPC placed by Philipp Deputy, CNM. CE was 5/80/-1. Terbutaline also given. After recovery, FHT showed moderate variability and + accels.   Plan: This is the second prolonged decel requiring terbutaline and cessation of pitocin. After the first prolonged deceleration there was fetal recovery for several hours. Pitocin was able to be resumed and she has made cervical change from 3 to 5 cm. FHT have shown recovery again. One deceleration following the prolonged was variable in appearance, so we have started an amnioinfusion.   I discussed the patient the possibility of cesarean delivery. The risks of surgery were discussed with the patient including but were not limited to: bleeding which may require transfusion or reoperation; infection which may require antibiotics; injury to bowel, bladder, ureters or other surrounding organs; injury to the fetus; need for additional procedures including hysterectomy in the event of a life-threatening hemorrhage; formation of adhesions; placental abnormalities with subsequent pregnancies; incisional problems; thromboembolic phenomenon and other postoperative/anesthesia complications.    We signed the consent in the event the decelerations are persistent or another prolonged occurs.   Milas Hock, MD Attending Obstetrician & Gynecologist, [facility], [facility]

## 2023-10-22 NOTE — Progress Notes (Signed)
 Pt refusing urinary catheter removal at this time. Educated pt on reasons for removing catheter now and she stated that she wants to wait a little longer.

## 2023-10-22 NOTE — Lactation Note (Signed)
 This note was copied from a baby's chart. Lactation Consultation Note  Patient Name: Amy Salinas [id number] Date: 10/22/2023 Age:37 hours Reason for consult: Initial assessment;1st time breastfeeding;Primapara;Term  P1, 39 wks, @ 5 hrs of life. Baby not in room with LC rounding- mom will "call" for her after breakfast. Discussed baby will eat every 3-4 hours when fed formula. Encouraged mom to call for next feeding assistance or anytime today as desired. LC services and milk storage shared with mom.   Maternal Data Does the patient have breastfeeding experience prior to this delivery?: No  Feeding Mother's Current Feeding Choice: Breast Milk and Formula   Interventions Interventions: Education;LC Services brochure (Milk Storage Guidelines)  Discharge Pump:  (Per mom- no pump @ home, Insurance does not work with [employer], discussed availability of a hand pump to go home with)  Consult Status Consult Status: Follow-up Date: 10/23/23 Follow-up type: In-patient    [facility] 10/22/2023, 8:54 AM

## 2023-10-22 NOTE — Transfer of Care (Signed)
 Immediate Anesthesia Transfer of Care Note  Patient: EVVIE BEHRMANN  Procedure(s) Performed: CESAREAN SECTION (Abdomen)  Patient Location: PACU  Anesthesia Type:Epidural  Level of Consciousness: drowsy and patient cooperative  Airway & Oxygen Therapy: Patient Spontanous Breathing and Patient connected to nasal cannula oxygen  Post-op Assessment: Report given to RN and Post -op Vital signs reviewed and stable  Post vital signs: Reviewed and stable  Last Vitals:  Vitals Value Taken Time  BP 87/50 10/22/23 0400  Temp    Pulse 50 10/22/23 0403  Resp 12 10/22/23 0403  SpO2 100 % 10/22/23 0403  Vitals shown include unfiled device data.  Last Pain:  Vitals:   10/22/23 0348  TempSrc:   PainSc: 0-No pain         Complications: No notable events documented.

## 2023-10-22 NOTE — Anesthesia Postprocedure Evaluation (Signed)
 Anesthesia Post Note  Patient: Amy Salinas  Procedure(s) Performed: CESAREAN SECTION (Abdomen)     Patient location during evaluation: PACU Anesthesia Type: Epidural Level of consciousness: awake Pain management: pain level controlled Vital Signs Assessment: post-procedure vital signs reviewed and stable Respiratory status: spontaneous breathing, nonlabored ventilation and respiratory function stable Cardiovascular status: stable Postop Assessment: no headache, no backache and epidural receding Anesthetic complications: no   No notable events documented.  Last Vitals:  Vitals:   10/22/23 1040 10/22/23 1555  BP: 120/77 121/82  Pulse: 62 63  Resp: 16 17  Temp: (!) 36.1 C (!) 35.7 C  SpO2: 97% 100%    Last Pain:  Vitals:   10/22/23 1750  TempSrc:   PainSc: 8                  Linton Rump

## 2023-10-22 NOTE — Progress Notes (Signed)
 Patient ID: Amy Salinas, female   DOB: 11-15-1985, 38 y.o.   MRN: [id number]  Pit stopped x 1h after prolonged decel, and then restarted at 2300; comfortable w epidural now; s/p IV flagyl for recent BV dx; s/p Benadryl @ 2300 for itching  BPs 91/50, 111/66 FHR 115-120s, +accels, no decels Ctx q 2-4 mins with Pit at 56mu/min Cx deferred  IUP@39 .4wks IOL process gHTN  Continue to keep ctx reg with Pitocin; plan for AROM later on Continue to follow BPs Anticipate vag delivery  Arabella Merles CNM 10/22/2023 1:04 AM

## 2023-10-22 NOTE — Op Note (Signed)
 Amy Salinas PROCEDURE DATE: 10/22/2023  PREOPERATIVE DIAGNOSES: Intrauterine pregnancy at [redacted]w[redacted]d weeks gestation; non-reassuring fetal status  POSTOPERATIVE DIAGNOSES: The same  PROCEDURE: Primary Low Transverse Cesarean Section  SURGEON:  Dr. Milas Hock  ASSISTANT:  Dr. Sundra Aland  ANESTHESIOLOGY TEAM: Anesthesiologist: Linton Rump, MD CRNA: Claudina Lick, CRNA  INDICATIONS: Amy Salinas is a 38 y.o. G2P0010 at [redacted]w[redacted]d here for cesarean section secondary to the indications listed under preoperative diagnoses; please see preoperative note for further details.  The risks of surgery were discussed with the patient including but were not limited to: bleeding which may require transfusion or reoperation; infection which may require antibiotics; injury to bowel, bladder, ureters or other surrounding organs; injury to the fetus; need for additional procedures including hysterectomy in the event of a life-threatening hemorrhage; formation of adhesions; placental abnormalities wth subsequent pregnancies; incisional problems; thromboembolic phenomenon and other postoperative/anesthesia complications.  The patient concurred with the proposed plan, giving informed written consent for the procedure.    FINDINGS:  Viable female infant in cephalic presentation.  Apgars 9 and 9.  Amniotic fluid: clear.  Intact placenta, three vessel cord.  Normal uterus, fallopian tubes and ovaries bilaterally.  ANESTHESIA: epidural INTRAVENOUS FLUIDS: 900 ml   ESTIMATED BLOOD LOSS: 260 ml URINE OUTPUT:  50 ml SPECIMENS: Placenta sent to pathology, cord gases sent COMPLICATIONS: None immediate  PROCEDURE IN DETAIL:  The patient preoperatively received intravenous antibiotics and had sequential compression devices applied to her lower extremities.  She was then taken to the operating room where the epidural was dosed up to a surgical level and found to be adequate. She was then placed in a dorsal  supine position with a leftward tilt, and prepped and draped in a sterile manner.  A foley catheter was  was already in place.  A Pfannenstiel skin incision was made with scalpel and carried through to the underlying layer of fascia. The fascia was incised in the midline, and this incision was extended bluntly. The rectus muscles were separated in the midline and the peritoneum was entered bluntly.   The bladder blade and rich retractor were introduced into the abdominal cavity.  Attention was turned to the lower uterine segment where a low transverse hysterotomy was made with a scalpel and extended bilaterally bluntly.  The infant was successfully delivered, the cord was clamped and cut after one minute, and the infant was handed over to the awaiting neonatology team. Uterine massage was then administered, and the placenta delivered intact with a three-vessel cord. The uterus was then cleared of clots and debris due to apparent membranes.  Uterus was exteriorized.  The hysterotomy was closed with 0 Vicryl in a running fashion. A second layer was done for hemostasis in a running unlocked fashion.    The pelvis was cleared of all clot and debris. Hemostasis was confirmed on all surfaces.  The retractors were removed.  The uterus was returned to the abdomen. The incision was once again inspected and noted to be hemostatic.  The peritoneum was closed with a 2-0 Vicryl running stitch. The fascia was then closed using 0 Vicryl in a running fashion.  The subcutaneous layer was irrigated, and was found to be hemostatic. The skin was closed with a 4-0 monocryl subcuticular stitch. The patient tolerated the procedure well. Sponge, instrument and needle counts were correct x 3.  She was taken to the recovery room in stable condition.   Milas Hock, MD Attending Obstetrician & Gynecologist, [facility]  for [employer], [facility]

## 2023-10-23 LAB — CULTURE, BETA STREP (GROUP B ONLY)

## 2023-10-23 MED ORDER — HYDROXYZINE HCL 25 MG PO TABS
25.0000 mg | ORAL_TABLET | Freq: Four times a day (QID) | ORAL | Status: DC | PRN
  Administered 2023-10-25: 25 mg via ORAL
  Filled 2023-10-23: qty 1

## 2023-10-23 MED ORDER — HYDROXYZINE HCL 25 MG PO TABS
25.0000 mg | ORAL_TABLET | Freq: Four times a day (QID) | ORAL | Status: DC | PRN
  Administered 2023-10-23: 25 mg via ORAL
  Filled 2023-10-23: qty 1

## 2023-10-23 MED ORDER — DIPHENHYDRAMINE HCL 25 MG PO CAPS
25.0000 mg | ORAL_CAPSULE | Freq: Four times a day (QID) | ORAL | Status: DC | PRN
  Administered 2023-10-23 – 2023-10-24 (×2): 25 mg via ORAL
  Filled 2023-10-23 (×2): qty 1

## 2023-10-23 NOTE — Lactation Note (Signed)
 This note was copied from a baby's chart. Lactation Consultation Note  Patient Name: Amy Salinas BJYNW'G Date: 10/23/2023 Age:37 hours Reason for consult: Follow-up assessment;1st time breastfeeding;Term;Infant < 6lbs (Infant with weight loss -4.68%, See MOB-MR: C/S delivery, AMA) LC ask MOB her current feeding plan.Per MOB she wants to breast and formula feed infant. MOB has latch infant twice today for 20 minutes, but mostly formula she thought she did not have any colostrum. LC discussed law of supply and demand, that if she latch infant first every feeding the suckling will stimulate her body to make more milk. MOB open to using the DEBP, when pumping she was expressing colostrum, which she will offer infant at the next feeding. MOB knows that her colostrum is safe for 4 hours at room temperature whereas formula once open must be used within 1 hour. LC did not observe latch due infant recently receiving 20 mls of formula at 1740 pm and asleep in the basinet.   MOB current feeding plan: 1- MOB will latch infant first every feeding, every 2-3 hours, skin to skin. 2- MOB knows to ask for latch assistance if needed. 3- Afterwards MOB will supplement infant with any EBM first that is pumped and then continue to supplement infant with formula. MOB has Breastfeeding Supplementation handout and knows on Day 2 if infant latches at the breast to offer 12 mls per feeding, if no latch then 15-30 mls per feeding. 4- MOB will continue to use DEBP every 3 hours for 15 minutes on initial setting.  Maternal Data    Feeding Mother's Current Feeding Choice: Breast Milk and Formula Nipple Type: Slow - flow  LATCH Score  LC did not observe latch due infant recently receiving 20 mls of 20 kcal formula at 1740 pm.                   Lactation Tools Discussed/Used Tools: Flanges;Pump Flange Size: 24 Breast pump type: Double-Electric Breast Pump Pump Education: Setup, frequency, and  cleaning Reason for Pumping: Infant less than 6 lbs at birth, MOB not been latching infant with every feeding to help stimulate and establish her milk supply Pumping frequency: MOB will continue to pump every 3 hours for 15 minutes at inital setting.  Interventions    Discharge    Consult Status Consult Status: Follow-up Date: 10/24/23 Follow-up type: In-patient    Frederico Hamman 10/23/2023, 6:38 PM

## 2023-10-23 NOTE — Clinical Social Work Maternal (Signed)
 CLINICAL SOCIAL WORK MATERNAL/CHILD NOTE  Patient Details  Name: Amy Salinas MRN: [id number] Date of Birth: 02-05-1986  Date:  10/23/2023  Clinical Social Worker Initiating Note:  Willaim Rayas Abbe Bula Date/Time: Initiated:  10/23/23/1440     Child's Name:  Amy Salinas 10/22/2023   Biological Parents:  Mother, Father Amy Salinas 07/16/87, Victorio Palm 02/01/1977)   Need for Interpreter:  None   Reason for Referral:  Behavioral Health Concerns, Homelessness, Current Substance Use/Substance Use During Pregnancy     Address:  [address] [place] [address]    Phone number:  [phone] (home)     Additional phone number:   Household Members/Support Persons (HM/SP):   Household Member/Support Person 1, Household Member/Support Person 2   HM/SP Name Relationship DOB or Age  HM/SP -1 Victorio Palm spouse 02/01/1977  HM/SP -2 Cedric Danns Father 08/07/1950  HM/SP -3        HM/SP -4        HM/SP -5        HM/SP -6        HM/SP -7        HM/SP -8          Natural Supports (not living in the home):  Extended Family   Professional Supports: None   Employment: Unemployed   Type of Work:     Education:  Engineer, agricultural   Homebound arranged:    Surveyor, quantity Resources:  Medicaid   Other Resources:  Sales executive  , [employer]   Cultural/Religious Considerations Which May Impact Care:    Strengths:  Pediatrician chosen, Compliance with medical plan  , Ability to meet basic needs     Psychotropic Medications:         Pediatrician:    Armed forces operational officer area  Pediatrician List:   [facility]  [place]    [place]    [place] [facility]      Pediatrician Fax Number:    Risk Factors/Current Problems:      Cognitive State:      Mood/Affect:  Calm  , Comfortable     CSW Assessment: CSW received a consult for  MOB MH hx, homelessness, substance use. CSW met with MOB to complete  assessment and offer support. CSW entered the room and observed MOB resting in bed FOB at bedside and the infant in the bassinet. CSW introduced self and requested to speak with MOB alone, FOB willingly stepped out of the room. CSW inquired about how MOB was feeling, MOB reported to be in a little pain and tired. CSW inquired MOB had pain medication recently MOB reported she had and the nurses have been doing well abut giving her medication routinely. MOB was pleasant and cooperative throughout the assessment. CSW confirmed MOB address and phone  number, MOB verified the information on file was correct.   CSW inquired about MOB MH hx, MOB reported she was diagnosed with Bipolar 1, PTSD in 2020. CSW inquired about how MOB managed her symptoms, MOB reported she does not take medication nor is she involved in therapy. CSW inquired about how MOB copes with her MH diagnosis, MOB reported she has been "fine and had no symptoms" CSW inquired about the noted instance if mania, MOB denied bipolar symptoms during the pregnancy. CSW inquired if MOB plans to readdress her MH, MOB reported she plane to return to [facility] to address her MH needs. CSW assessed for  current safety, MOB denied any SI, HI or DV. CSW provided education regarding the baby blues period vs. perinatal mood disorders, discussed treatment and gave resources for mental health follow up if concerns arise.  CSW recommends self-evaluation during the postpartum time period using the New Mom Checklist from Postpartum Progress and encouraged MOB to contact a medical professional if symptoms are noted at any time. MOB identified her spouse as her primary support.    CSW inquired about MOB substance use hx, MOB reported she stopped use when she found out she was pregnant, MOB reported she did use THC to help her eat but no other substances. CSW explained the hospital drug screen policy, MOB verbalized understanding. CSW informed MOB infant UDS was negative and the  CDS was pending and if positive for any substance a CPS report would be made, MOB verbalized understanding. MOB verbalized understanding. CSW offered MOB substance abuse resources, MOB declined resources.   CSW provided review of Sudden Infant Death Syndrome (SIDS) precautions.  CSW inquired about resources for the infant, MOB reported she has a car seat but does not have a place for the infant  sleep, CSW informed MOB a Pack n Play could be provided for for infant to sleep, MOB was agreeable. CSW provided a Pack and Play. MOB inquired about resources for housing, CSW provided MOB with family shelters. MOB the immediate plan is to return to her dad's house at the address listed but MOB wanted to have a back up play for housing. CSW provided MOB with [facility] [employer] as well has Theatre stage manager. CSW made referral to [employer] for additional support. CSW also made [facility] referral with MOB's verbal permission.   CSW identifies no further need for intervention and no barriers to discharge at this time.  CSW Plan/Description:  Sudden Infant Death Syndrome (SIDS) Education, Perinatal Mood and Anxiety Disorder (PMADs) Education, Hospital Drug Screen Policy Information, CSW Will Continue to Monitor Umbilical Cord Tissue Drug Screen Results and Make Report if Warranted, Other Information/Referral to [employer], [place] 10/23/2023, 2:45 PM

## 2023-10-23 NOTE — Progress Notes (Signed)
 Subjective: Postpartum Day 1: Cesarean Delivery Patient reports incisional pain, tolerating PO, and no problems voiding. Has been taking ibuprofen and tylenol, but hasn't taking oxycodone or gabapentin since yesterday. Ambulating well. Overall feeling well, just tired.  Objective: Vital signs in last 24 hours: Temp:  [96.3 F (35.7 C)-98.4 F (36.9 C)] 98.4 F (36.9 C) (02/27 2130) Pulse Rate:  [63-79] 79 (02/28 0709) Resp:  [16-18] 18 (02/28 0709) BP: (104-137)/(61-84) 115/84 (02/28 0811) SpO2:  [99 %-100 %] 100 % (02/28 0709)  Physical Exam:  General: alert and cooperative Lochia: appropriate Uterine Fundus: firm Incision: Honeycomb dressing in place, intact DVT Evaluation: No evidence of DVT seen on physical exam.  Recent Labs    10/21/23 0938 10/22/23 0723  HGB 11.1* 11.6*  HCT 34.2* 35.9*    Assessment/Plan: Status post Cesarean section. Doing well postoperatively.  Continue current care. Encouraged to take oxycodone or gabapentin prn for pain control. Chewing gum and continue stool softeners for bowel movement.   Herminio Commons, Student-MidWife 10/23/2023, 12:18 PM

## 2023-10-24 MED ORDER — SENNOSIDES-DOCUSATE SODIUM 8.6-50 MG PO TABS: 2.0000 | ORAL_TABLET | Freq: Every day | ORAL | Status: DC

## 2023-10-24 MED ORDER — COCONUT OIL OIL
1.0000 | TOPICAL_OIL | Status: AC | PRN
Start: 2023-10-24 — End: ?

## 2023-10-24 MED ORDER — DIBUCAINE (PERIANAL) 1 % EX OINT: 1.0000 | TOPICAL_OINTMENT | CUTANEOUS | Status: AC | PRN

## 2023-10-24 MED ORDER — OXYCODONE HCL 5 MG PO TABS
5.0000 mg | ORAL_TABLET | ORAL | 0 refills | Status: DC | PRN
  Filled 2023-10-24: qty 20, 4d supply, fill #0

## 2023-10-24 MED ORDER — GABAPENTIN 100 MG PO CAPS
100.0000 mg | ORAL_CAPSULE | Freq: Three times a day (TID) | ORAL | 0 refills | Status: DC
  Filled 2023-10-24: qty 20, 7d supply, fill #0

## 2023-10-24 MED ORDER — IBUPROFEN 600 MG PO TABS
600.0000 mg | ORAL_TABLET | Freq: Four times a day (QID) | ORAL | 1 refills | Status: DC
  Filled 2023-10-24: qty 90, 23d supply, fill #0

## 2023-10-24 MED ORDER — HYDROXYZINE HCL 25 MG PO TABS
25.0000 mg | ORAL_TABLET | Freq: Four times a day (QID) | ORAL | 0 refills | Status: DC | PRN
  Filled 2023-10-24: qty 30, 8d supply, fill #0

## 2023-10-24 MED ORDER — CYCLOBENZAPRINE HCL 10 MG PO TABS
10.0000 mg | ORAL_TABLET | Freq: Two times a day (BID) | ORAL | 0 refills | Status: AC | PRN
  Filled 2023-10-24: qty 20, 10d supply, fill #0

## 2023-10-24 MED ORDER — MEDROXYPROGESTERONE ACETATE 150 MG/ML IM SUSP: 150.0000 mg | Freq: Once | INTRAMUSCULAR | Status: DC

## 2023-10-24 MED ORDER — WITCH HAZEL-GLYCERIN EX PADS
1.0000 | MEDICATED_PAD | CUTANEOUS | 12 refills | Status: AC | PRN
Start: 2023-10-24 — End: ?
  Filled 2023-10-24: qty 40, fill #0

## 2023-10-24 NOTE — Lactation Note (Signed)
 This note was copied from a baby's chart. Lactation Consultation Note  Patient Name: Amy Salinas [id number] Date: 10/24/2023 Age:37 hours Reason for consult: Follow-up assessment;Primapara;1st time breastfeeding;Term;Infant < 6lbs  P1- RN requested for LC's assistance with providing MOB with a DEBP for home use. MOB does not qualify for a Stork pump, nor has the funds for a [facility] loaner pump. LC provided MOB with a manual pump and sent a referral to [facility] for a DEBP. Due to infant's weight, MOB will qualify for a DEBP and formula through [facility]. MOB reports that she has been mostly pumping and had recently collected 50 mL of EBM. LC praised MOB. MOB denies having any questions or concerns at this time. LC encouraged MOB to call for further assistance as needed.  Maternal Data Has patient been taught Hand Expression?: Yes Does the patient have breastfeeding experience prior to this delivery?: No  Feeding Mother's Current Feeding Choice: Breast Milk and Formula Nipple Type: Nfant Standard Flow (white)  Lactation Tools Discussed/Used Tools: Pump;Flanges;Shells;Hands-free pumping top Flange Size: 24 Breast pump type: Double-Electric Breast Pump;Manual Pump Education: Setup, frequency, and cleaning;Milk Storage Reason for Pumping: MOB request Pumping frequency: 15-20 min every 3 hrs Pumped volume: 50 mL  Interventions Interventions: Breast feeding basics reviewed;Shells;Hand pump;Education;LC Services brochure  Discharge Discharge Education: Engorgement and breast care;Warning signs for feeding baby Pump: Manual;Personal WIC Program: Yes  Consult Status Consult Status: Follow-up Date: 10/25/23 Follow-up type: In-patient    Dema Severin BS, IBCLC 10/24/2023, 6:24 PM

## 2023-10-24 NOTE — Progress Notes (Signed)
 Post Partum Day 2 Subjective:  Amy Salinas is a 38 y.o. G2P1011 [redacted]w[redacted]d s/p pLTCS.  No acute events overnight.  Pt denies problems with ambulating, voiding or po intake.  She denies nausea or vomiting.  Pain is well controlled.  She has had flatus.  Lochia Small.  Plan for birth control is Depo-Provera.  Method of Feeding: formula  Asked about her teeth falling out this pregnancy and normality of this. She did receive dental care this pregnancy  Objective: Blood pressure 131/77, pulse 97, temperature 98.6 F (37 C), resp. rate 18, height 5\' 2"  (1.575 m), weight 93.3 kg, last menstrual period 06/09/2022, SpO2 100%, unknown if currently breastfeeding.  Physical Exam:  General: alert, cooperative and no distress Lochia:normal flow Chest: normal WOB Heart: Regular rate Abdomen: +BS, soft, mild TTP (appropriate) Uterine Fundus: firm, below umbilicus DVT Evaluation: No evidence of DVT seen on physical exam. Extremities: Noe edema Incision: c/d/i  Recent Labs    10/22/23 0723  HGB 11.6*  HCT 35.9*    Assessment/Plan:  ASSESSMENT: Amy Salinas is a 38 y.o. G2P1011 [redacted]w[redacted]d s/p pLTCS  Plan for discharge tomorrow Continue routine PP care Breastfeeding support PRN #CS: stable post op [facility]  #Social concerns: seen by SW and no barriers to discharge   LOS: 3 days   Federico Flake 10/24/2023, 12:06 PM

## 2023-10-25 MED ORDER — HYDROXYZINE HCL 25 MG PO TABS
25.0000 mg | ORAL_TABLET | Freq: Four times a day (QID) | ORAL | 0 refills | Status: AC | PRN
Start: 2023-10-25 — End: ?

## 2023-10-25 MED ORDER — IBUPROFEN 600 MG PO TABS: 600.0000 mg | ORAL_TABLET | Freq: Four times a day (QID) | ORAL | 1 refills | Status: AC | PRN

## 2023-10-25 MED ORDER — GABAPENTIN 100 MG PO CAPS: 100.0000 mg | ORAL_CAPSULE | Freq: Three times a day (TID) | ORAL | 0 refills | Status: AC

## 2023-10-25 MED ORDER — SENNOSIDES-DOCUSATE SODIUM 8.6-50 MG PO TABS
2.0000 | ORAL_TABLET | Freq: Every day | ORAL | Status: AC
Start: 2023-10-25 — End: ?

## 2023-10-25 MED ORDER — OXYCODONE HCL 5 MG PO TABS: 5.0000 mg | ORAL_TABLET | ORAL | 0 refills | Status: AC | PRN

## 2023-10-25 NOTE — Lactation Note (Signed)
 This note was copied from a baby's chart. Lactation Consultation Note  Patient Name: Amy Salinas BJYNW'G Date: 10/25/2023 Age:37 days Reason for consult: Maternal discharge;Term  P1, 39 wks, @ 82 hrs. Encouraged mom to keep working on big mouth latch with baby and use EBM or coconut oil after each feed. Discussed cluster feeding overnight/ early morning brings in our milk supply, shared expectations of milk coming in. Highlighted risk of engorgement. Discussed hand pump/express to soften breasts, motrin as anti-inflammatory, and ice packs for 10-20 minutes post feed/pumping if still over-full is the best treatments for inflamed/engorged breasts. Encouraged hand pump best @ moving milk from engorged breasts. Re-enforced LC services and milk storage with mom.  Maternal Data Does the patient have breastfeeding experience prior to this delivery?: No  Feeding Mother's Current Feeding Choice: Breast Milk and Formula Nipple Type: Nfant Standard Flow (white)   Lactation Tools Discussed/Used Tools: Pump Breast pump type: Double-Electric Breast Pump Pump Education: Milk Storage Reason for Pumping: Mom pumping well, breasts filling Pumping frequency: Pump every 3 hrs if baby not to breast  Interventions Interventions: Hand pump;Education;CDC milk storage guidelines;LC Services brochure  Discharge Discharge Education: Engorgement and breast care Pump: Manual;Personal WIC Program: Yes  Consult Status Consult Status: Complete Date: 10/25/23    Idamae Lusher 10/25/2023, 1:29 PM

## 2023-10-26 ENCOUNTER — Other Ambulatory Visit (HOSPITAL_COMMUNITY): Payer: Self-pay

## 2023-10-26 LAB — SURGICAL PATHOLOGY

## 2023-10-29 ENCOUNTER — Other Ambulatory Visit: Payer: Self-pay

## 2023-10-29 ENCOUNTER — Other Ambulatory Visit (HOSPITAL_COMMUNITY): Payer: Self-pay

## 2023-10-29 ENCOUNTER — Ambulatory Visit (INDEPENDENT_AMBULATORY_CARE_PROVIDER_SITE_OTHER): Payer: Self-pay | Admitting: *Deleted

## 2023-10-29 VITALS — BP 152/84 | HR 68 | Ht 62.0 in | Wt 203.9 lb

## 2023-10-29 DIAGNOSIS — R030 Elevated blood-pressure reading, without diagnosis of hypertension: Secondary | ICD-10-CM

## 2023-10-29 DIAGNOSIS — Z4889 Encounter for other specified surgical aftercare: Secondary | ICD-10-CM

## 2023-10-29 MED ORDER — NIFEDIPINE ER OSMOTIC RELEASE 30 MG PO TB24
30.0000 mg | ORAL_TABLET | Freq: Every day | ORAL | 1 refills | Status: AC
Start: 2023-10-29 — End: ?
  Filled 2023-10-29: qty 30, 30d supply, fill #0

## 2023-10-29 NOTE — Progress Notes (Signed)
 Pt presents for incision check following C/S on 2/27.  She reports mild/moderate abdominal pain which is relieved with Ibuprofen and Oxycodone. Honeycomb dressing was removed from operative site and incision was found to be well healed. No redness, swelling or drainage was observed. Proper daily cleansing instruction were given. She reports new onset of LLE edema x2 days. Pt also has been having lightheadedness. She has been having some pressure behind her eyes and @ temples bilaterally. She denies H/A.  BP - 149/89, P - 67.   Re-check BP - 152/84, P - 68  Consult w/Dr. Crissie Reese who recommends CBC, CM24 and begin Nifedipine 30 mg XL - one tablet daily. Pt was questioned regarding if any recent substance use due to past history noted in EMR - she denied. Pt voiced understanding of all information and instructions given.

## 2023-10-30 LAB — CBC
Hematocrit: 29.5 % — ABNORMAL LOW (ref 34.0–46.6)
Hemoglobin: 9.1 g/dL — ABNORMAL LOW (ref 11.1–15.9)
MCH: 23.6 pg — ABNORMAL LOW (ref 26.6–33.0)
MCHC: 30.8 g/dL — ABNORMAL LOW (ref 31.5–35.7)
MCV: 77 fL — ABNORMAL LOW (ref 79–97)
Platelets: 322 10*3/uL (ref 150–450)
RBC: 3.85 x10E6/uL (ref 3.77–5.28)
RDW: 15.7 % — ABNORMAL HIGH (ref 11.7–15.4)
WBC: 7.7 10*3/uL (ref 3.4–10.8)

## 2023-10-30 LAB — COMP. METABOLIC PANEL (12)
AST: 19 IU/L (ref 0–40)
Albumin: 3.8 g/dL — ABNORMAL LOW (ref 3.9–4.9)
Alkaline Phosphatase: 115 IU/L (ref 44–121)
BUN/Creatinine Ratio: 36 — ABNORMAL HIGH (ref 9–23)
BUN: 24 mg/dL — ABNORMAL HIGH (ref 6–20)
Bilirubin Total: <0.2 mg/dL (ref 0.0–1.2)
Calcium: 9.4 mg/dL (ref 8.7–10.2)
Chloride: 104 mmol/L (ref 96–106)
Creatinine, Ser: 0.67 mg/dL (ref 0.57–1.00)
Globulin, Total: 2.7 g/dL (ref 1.5–4.5)
Glucose: 75 mg/dL (ref 70–99)
Potassium: 4.9 mmol/L (ref 3.5–5.2)
Sodium: 139 mmol/L (ref 134–144)
Total Protein: 6.5 g/dL (ref 6.0–8.5)
eGFR: 116 mL/min/{1.73_m2} (ref >59)

## 2023-11-10 ENCOUNTER — Other Ambulatory Visit (HOSPITAL_COMMUNITY): Payer: Self-pay

## 2023-12-09 ENCOUNTER — Ambulatory Visit: Payer: MEDICAID | Admitting: Obstetrics & Gynecology
